# Patient Record
Sex: Female | Born: 1965 | Race: White | Hispanic: No | Smoking: Former smoker
Health system: Southern US, Community
[De-identification: ages and names within clinical notes are randomized; demographics above are authoritative.]

## PROBLEM LIST (undated history)

## (undated) DIAGNOSIS — E119 Type 2 diabetes mellitus without complications: Secondary | ICD-10-CM

## (undated) DIAGNOSIS — G4700 Insomnia, unspecified: Secondary | ICD-10-CM

## (undated) DIAGNOSIS — J449 Chronic obstructive pulmonary disease, unspecified: Secondary | ICD-10-CM

## (undated) DIAGNOSIS — F419 Anxiety disorder, unspecified: Secondary | ICD-10-CM

---

## 2015-05-29 ENCOUNTER — Encounter: Payer: Self-pay | Admitting: Physician Assistant

## 2015-05-29 ENCOUNTER — Ambulatory Visit (INDEPENDENT_AMBULATORY_CARE_PROVIDER_SITE_OTHER): Payer: Medicaid Other | Admitting: Physician Assistant

## 2015-05-29 VITALS — BP 125/94 | HR 100 | Ht 68.0 in | Wt 90.0 lb

## 2015-05-29 DIAGNOSIS — E118 Type 2 diabetes mellitus with unspecified complications: Secondary | ICD-10-CM

## 2015-05-29 DIAGNOSIS — R059 Cough, unspecified: Secondary | ICD-10-CM | POA: Insufficient documentation

## 2015-05-29 DIAGNOSIS — IMO0002 Reserved for concepts with insufficient information to code with codable children: Secondary | ICD-10-CM

## 2015-05-29 DIAGNOSIS — R7309 Other abnormal glucose: Secondary | ICD-10-CM | POA: Diagnosis not present

## 2015-05-29 DIAGNOSIS — R05 Cough: Secondary | ICD-10-CM | POA: Diagnosis not present

## 2015-05-29 DIAGNOSIS — R634 Abnormal weight loss: Secondary | ICD-10-CM

## 2015-05-29 DIAGNOSIS — E1165 Type 2 diabetes mellitus with hyperglycemia: Secondary | ICD-10-CM

## 2015-05-29 DIAGNOSIS — K5900 Constipation, unspecified: Secondary | ICD-10-CM | POA: Diagnosis not present

## 2015-05-29 DIAGNOSIS — R739 Hyperglycemia, unspecified: Secondary | ICD-10-CM

## 2015-05-29 DIAGNOSIS — Z8719 Personal history of other diseases of the digestive system: Secondary | ICD-10-CM | POA: Diagnosis not present

## 2015-05-29 DIAGNOSIS — R631 Polydipsia: Secondary | ICD-10-CM | POA: Diagnosis not present

## 2015-05-29 LAB — POCT GLYCOSYLATED HEMOGLOBIN (HGB A1C): Hemoglobin A1C: >14

## 2015-05-29 LAB — POCT URINALYSIS DIPSTICK
Bilirubin, UA: NEGATIVE
Blood, UA: NEGATIVE
Glucose, UA: 500
KETONES UA: 40
Leukocytes, UA: NEGATIVE
Nitrite, UA: NEGATIVE
PH UA: 5
PROTEIN UA: NEGATIVE
UROBILINOGEN UA: 0.2

## 2015-05-29 MED ORDER — INSULIN ASPART PROT & ASPART (70-30 MIX) 100 UNIT/ML ~~LOC~~ SUSP: 6.0000 [IU] | Freq: Two times a day (BID) | SUBCUTANEOUS | Status: DC

## 2015-05-29 MED ORDER — AMBULATORY NON FORMULARY MEDICATION: Status: DC

## 2015-05-29 MED ORDER — METFORMIN HCL 1000 MG PO TABS: 1000.0000 mg | ORAL_TABLET | Freq: Two times a day (BID) | ORAL | Status: DC

## 2015-05-29 NOTE — Patient Instructions (Addendum)
Start metformin 1000mg  twice a day with food.  Keep log of fasting blood sugar daily.  Increase units by 2units every 3 days until fasting glucose gets to be 150.    Follow up in 1 month.

## 2015-05-29 NOTE — Progress Notes (Signed)
Subjective:    Patient ID: Valerie Pratt, female    DOB: [birth year], 50 y.o.   MRN: [id number]  HPI  Patient is a 50 year old female who presents to the clinic to establish care. She is accompanied by her sister and niece. 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  .. Family History  Problem Relation Age of Onset  . Cancer Mother     bladder  . Cancer Father     leukemia   .Marland Kitchen Social History   Social History  . Marital Status: Legally Separated    Spouse Name: N/A  . Number of Children: N/A  . Years of Education: N/A   Occupational History  . Not on file.   Social History Main Topics  . Smoking status: Current Every Day Smoker  . Smokeless tobacco: Not on file  . Alcohol Use: No  . Drug Use: No  . Sexual Activity: No   Other Topics Concern  . Not on file   Social History Narrative  . No narrative on file    She is very concerned today with multiple symptoms. She's had weight loss. She is not trying to lose weight. She is typically around 110 pounds and she is now 90. She has noticed the majority of weight loss has been since March 1 this year. She has a very strong appetite. She is eating or to 5 meals a day. She is also drinking constantly. She complains of constipation, excessive thirst, weakness, muscle cramps, fatigue. She went to the emergency room a few weeks ago. Her random sugar was 267 and she was spilling greater than 500 units of sugar in her urine. She was told this was okay. She wonders why she continues to fill this bad. She has a history of pancreatitis  She does not have any insurance and this concerns her.  Review of Systems     Objective:   Physical Exam  Constitutional: She is oriented to person, place, and time.  Frail.   HENT:  Head: Normocephalic and atraumatic.  Cardiovascular: Normal rate, regular rhythm and normal heart sounds.   Pulmonary/Chest: Effort normal and breath  sounds normal. She has no wheezes.  Abdominal:  Completely disfigured abdomen. There is no visualization of abdominal muscles or omentum. You can see bulges of intestine through her skin.  Neurological: She is alert and oriented to person, place, and time.  Psychiatric: She has a normal mood and affect. Her behavior is normal.          Assessment & Plan:  Uncontrolled type 2 diabetes-A!C is greater than 14. Glucose found in UA.    Discuss with patient most of her symptoms above can be contributed to diabetes that is uncontrolled. Due to history of pancreatitis we have to be careful with the oral medications we can try. Since she do not have insurance we cannot prescribe the newer medications such as GLT-2. I'll start with metformin 1000 mg twice a day. Discussed side effects of nausea and diarrhea. Encouraged patient to start with one half tab with meals and increase once tolerated. Patient was given her first glucometer with test strips and lancets. At first we will start with testing just fasting. Since patient does not have insurance she cannot afford long-acting insulins. We are going to have to go with Novolin 70/30 I will start with 6 units twice a day with meals. We will increase by 1 unit twice a  day until fasting glucose at 150. This is not the typical way to start this medication however I'm concerned with how many times she is quite to be want to stick herself. I do not want to [facility]. I would like for her to keep a log of her sugars in follow-up in one month. I strongly would consider getting on Medicaid. Certainly if we can keep the sugars down she would be feeling a lot better. Discussed signs and symptoms that she might need to go to the emergency room. She will give the intermediate acting insulin in her thighs. Her abdomen is too frail.  Cough- reassured lungs sounded good. Follow up if worsened.

## 2015-05-30 ENCOUNTER — Telehealth: Payer: Self-pay | Admitting: Physician Assistant

## 2015-05-30 ENCOUNTER — Other Ambulatory Visit: Payer: Self-pay | Admitting: Physician Assistant

## 2015-05-30 LAB — CBC WITH DIFFERENTIAL/PLATELET
BASOS PCT: 0 %
Basophils Absolute: 0 cells/uL (ref 0–200)
EOS PCT: 0 %
Eosinophils Absolute: 0 cells/uL — ABNORMAL LOW (ref 15–500)
HCT: 50 % — ABNORMAL HIGH (ref 35.0–45.0)
HEMOGLOBIN: 16.5 g/dL — AB (ref 11.7–15.5)
LYMPHS ABS: 1900 {cells}/uL (ref 850–3900)
LYMPHS PCT: 20 %
MCH: 30.4 pg (ref 27.0–33.0)
MCHC: 33 g/dL (ref 32.0–36.0)
MCV: 92.3 fL (ref 80.0–100.0)
MONO ABS: 570 {cells}/uL (ref 200–950)
MPV: 9.5 fL (ref 7.5–12.5)
Monocytes Relative: 6 %
NEUTROS PCT: 74 %
Neutro Abs: 7030 cells/uL (ref 1500–7800)
Platelets: 303 10*3/uL (ref 140–400)
RBC: 5.42 MIL/uL — AB (ref 3.80–5.10)
RDW: 14.3 % (ref 11.0–15.0)
WBC: 9.5 10*3/uL (ref 3.8–10.8)

## 2015-05-30 MED ORDER — INSULIN NPH ISOPHANE & REGULAR (70-30) 100 UNIT/ML ~~LOC~~ SUSP: 6.0000 [IU] | Freq: Two times a day (BID) | SUBCUTANEOUS | Status: DC

## 2015-05-30 NOTE — Telephone Encounter (Signed)
I sent over novolin. I took novolog off chart.

## 2015-05-30 NOTE — Telephone Encounter (Signed)
Pharmacist called to verify insulin Rx. States there were phone calls made yesterday to determine the cheapest insulin. The Rx sent over was for Novolog 70/30, this is much more expensive than Novolin 70/30. Pharmacist wants to know if they should continue with Novolog Rx or if we will send new Rx for Novolin. Will route to PCP and medical assistant for review.

## 2015-05-30 NOTE — Progress Notes (Signed)
Sent corrected insulin.

## 2015-05-30 NOTE — Telephone Encounter (Signed)
Pharmacy notified of corrected Rx. Pt will pick up today.

## 2015-05-31 LAB — COMPLETE METABOLIC PANEL WITH GFR
ALT: 14 U/L (ref 6–29)
AST: 16 U/L (ref 10–35)
Albumin: 4.4 g/dL (ref 3.6–5.1)
Alkaline Phosphatase: 59 U/L (ref 33–130)
BUN: 14 mg/dL (ref 7–25)
CO2: 23 mmol/L (ref 20–31)
Calcium: 9 mg/dL (ref 8.6–10.4)
Chloride: 94 mmol/L — ABNORMAL LOW (ref 98–110)
Creat: 0.48 mg/dL — ABNORMAL LOW (ref 0.50–1.05)
GFR, Est African American: >89 mL/min (ref >=60)
GLUCOSE: 254 mg/dL — AB (ref 65–99)
POTASSIUM: 5.2 mmol/L (ref 3.5–5.3)
SODIUM: 135 mmol/L (ref 135–146)
Total Bilirubin: 0.8 mg/dL (ref 0.2–1.2)
Total Protein: 6.8 g/dL (ref 6.1–8.1)

## 2015-05-31 LAB — C-PEPTIDE: C PEPTIDE: 0.22 ng/mL — AB (ref 0.80–3.85)

## 2015-05-31 LAB — TSH: TSH: 0.67 mIU/L

## 2015-05-31 LAB — T4, FREE: Free T4: 1.1 ng/dL (ref 0.8–1.8)

## 2015-05-31 LAB — T3, FREE: T3 FREE: 1.6 pg/mL — AB (ref 2.3–4.2)

## 2015-06-04 ENCOUNTER — Encounter: Payer: Self-pay | Admitting: Physician Assistant

## 2015-06-04 DIAGNOSIS — E109 Type 1 diabetes mellitus without complications: Secondary | ICD-10-CM | POA: Insufficient documentation

## 2015-06-26 ENCOUNTER — Encounter: Payer: Self-pay | Admitting: Physician Assistant

## 2015-06-26 ENCOUNTER — Ambulatory Visit (INDEPENDENT_AMBULATORY_CARE_PROVIDER_SITE_OTHER): Payer: Medicaid Other | Admitting: Physician Assistant

## 2015-06-26 VITALS — BP 105/64 | HR 92 | Ht 68.0 in | Wt 98.0 lb

## 2015-06-26 DIAGNOSIS — E1065 Type 1 diabetes mellitus with hyperglycemia: Secondary | ICD-10-CM

## 2015-06-26 DIAGNOSIS — E1043 Type 1 diabetes mellitus with diabetic autonomic (poly)neuropathy: Secondary | ICD-10-CM | POA: Diagnosis not present

## 2015-06-26 DIAGNOSIS — IMO0002 Reserved for concepts with insufficient information to code with codable children: Secondary | ICD-10-CM

## 2015-06-26 NOTE — Progress Notes (Signed)
   Subjective:    Patient ID: Valerie Pratt, female    DOB: 1965/01/12, 50 y.o.   MRN: [id number]  HPI  Pt is a 50 yo female who presents to the clinic for DM follow up at one month. At time of dx her A!C was greater than 14. She was losing weight. She had excessive thirst. She has no insurance so we had to use insulin she could afford. One month into treatment and she is at 11 units twice a day of NPH. She brings in logs and appears all her sugars have decreased; however, her fasting and lunch sugars have decreased more. Her before bed sugars still in 300's. She has had a few lows of 40-50 but eating quickly made her feel better. She is having some worsening vision bilaterally. She has gained 8lbs and much less thirsty. She is overall feeling better already.    Review of Systems  All other systems reviewed and are negative.      Objective:   Physical Exam  Constitutional: She appears well-developed and well-nourished.  Cardiovascular: Normal rate, regular rhythm and normal heart sounds.   Psychiatric: She has a normal mood and affect. Her behavior is normal.          Assessment & Plan:  Type 1 DM- currently we are trying to keep her sugars as controlled as we can until she can get insurance and see a specialist. Right now she is cash pay and cannot afford long acting insulin or specialist co-pay.   Vision changes- needs to see opthalmology.   Discussed how to use NPH insulin since fasting still not at goal increase every 3 days by 1 unit until fasting at 150. Check sugar before every meal and tirate using chart to accommodate meal time.  Follow up in 2 months.  She declines lipid testing and pneumonia shot due to cost.

## 2015-06-26 NOTE — Patient Instructions (Addendum)

## 2015-06-28 DIAGNOSIS — IMO0002 Reserved for concepts with insufficient information to code with codable children: Secondary | ICD-10-CM | POA: Insufficient documentation

## 2015-06-28 DIAGNOSIS — E1043 Type 1 diabetes mellitus with diabetic autonomic (poly)neuropathy: Secondary | ICD-10-CM | POA: Insufficient documentation

## 2015-06-28 DIAGNOSIS — E1065 Type 1 diabetes mellitus with hyperglycemia: Principal | ICD-10-CM

## 2015-07-16 ENCOUNTER — Telehealth: Payer: Self-pay | Admitting: *Deleted

## 2015-07-16 NOTE — Telephone Encounter (Signed)
Patient called and states she now has medicaid . She wanted to know if you wanted to change any of her medications.she has an appointment with you on 8/21

## 2015-07-17 ENCOUNTER — Other Ambulatory Visit: Payer: Self-pay | Admitting: Physician Assistant

## 2015-07-17 MED ORDER — INSULIN LISPRO 100 UNIT/ML ~~LOC~~ SOLN: 4.0000 [IU] | Freq: Three times a day (TID) | SUBCUTANEOUS | Status: DC

## 2015-07-17 MED ORDER — INSULIN GLARGINE 300 UNIT/ML ~~LOC~~ SOPN: 10.0000 [IU] | PEN_INJECTOR | Freq: Every day | SUBCUTANEOUS | Status: DC

## 2015-07-17 NOTE — Telephone Encounter (Signed)
STOP novolin 70/30. Start toujeo 10 units increasing by 2 units ever 3-5 days until fasting sugars at 100-120. Meal time insulin 4 units and then sliding scale. Come by office to pick up sliding scale.

## 2015-07-17 NOTE — Telephone Encounter (Signed)
Patient notified and voiced understanding and sliding scale up front. She will come by to pick up the sliding scale info tomorrow

## 2015-07-24 ENCOUNTER — Telehealth: Payer: Self-pay | Admitting: *Deleted

## 2015-07-24 NOTE — Telephone Encounter (Signed)
[facility] Toujeo would be denied because step therapy is required. Has to have tried levemir or lantus first.  New mediaid ID # is [id number] q. Note placed in [facility]

## 2015-07-28 ENCOUNTER — Other Ambulatory Visit: Payer: Self-pay | Admitting: Physician Assistant

## 2015-08-01 ENCOUNTER — Other Ambulatory Visit: Payer: Self-pay | Admitting: *Deleted

## 2015-08-01 ENCOUNTER — Telehealth: Payer: Self-pay | Admitting: *Deleted

## 2015-08-01 MED ORDER — AMBULATORY NON FORMULARY MEDICATION: 1 refills | Status: DC

## 2015-08-01 NOTE — Telephone Encounter (Signed)
Pt called stating that since the Toujeo was denied by medicaid due to needing to have tried levemir or lantus first.  She would like for you to send one of those in for her.

## 2015-08-15 ENCOUNTER — Other Ambulatory Visit: Payer: Self-pay | Admitting: *Deleted

## 2015-08-15 MED ORDER — AMBULATORY NON FORMULARY MEDICATION: 0 refills | Status: DC

## 2015-08-15 NOTE — Progress Notes (Unsigned)
Non amb

## 2015-08-26 ENCOUNTER — Encounter: Payer: Self-pay | Admitting: Physician Assistant

## 2015-08-26 ENCOUNTER — Ambulatory Visit (INDEPENDENT_AMBULATORY_CARE_PROVIDER_SITE_OTHER): Payer: Medicaid Other | Admitting: Physician Assistant

## 2015-08-26 VITALS — BP 121/71 | HR 90 | Ht 68.0 in | Wt 103.0 lb

## 2015-08-26 DIAGNOSIS — Z720 Tobacco use: Secondary | ICD-10-CM | POA: Diagnosis not present

## 2015-08-26 DIAGNOSIS — R05 Cough: Secondary | ICD-10-CM | POA: Diagnosis not present

## 2015-08-26 DIAGNOSIS — F329 Major depressive disorder, single episode, unspecified: Secondary | ICD-10-CM

## 2015-08-26 DIAGNOSIS — F32A Depression, unspecified: Secondary | ICD-10-CM | POA: Insufficient documentation

## 2015-08-26 DIAGNOSIS — J449 Chronic obstructive pulmonary disease, unspecified: Secondary | ICD-10-CM | POA: Insufficient documentation

## 2015-08-26 DIAGNOSIS — E109 Type 1 diabetes mellitus without complications: Secondary | ICD-10-CM | POA: Diagnosis not present

## 2015-08-26 DIAGNOSIS — E1065 Type 1 diabetes mellitus with hyperglycemia: Principal | ICD-10-CM

## 2015-08-26 DIAGNOSIS — Z23 Encounter for immunization: Secondary | ICD-10-CM

## 2015-08-26 DIAGNOSIS — F17200 Nicotine dependence, unspecified, uncomplicated: Secondary | ICD-10-CM | POA: Insufficient documentation

## 2015-08-26 DIAGNOSIS — R059 Cough, unspecified: Secondary | ICD-10-CM

## 2015-08-26 DIAGNOSIS — IMO0001 Reserved for inherently not codable concepts without codable children: Secondary | ICD-10-CM

## 2015-08-26 LAB — POCT GLYCOSYLATED HEMOGLOBIN (HGB A1C): HEMOGLOBIN A1C: 7.4

## 2015-08-26 MED ORDER — METFORMIN HCL 1000 MG PO TABS: 1000.0000 mg | ORAL_TABLET | Freq: Two times a day (BID) | ORAL | 2 refills | Status: DC

## 2015-08-26 MED ORDER — INSULIN NPH ISOPHANE & REGULAR (70-30) 100 UNIT/ML ~~LOC~~ SUSP: 6.0000 [IU] | Freq: Two times a day (BID) | SUBCUTANEOUS | 2 refills | Status: DC

## 2015-08-26 MED ORDER — BUPROPION HCL ER (SR) 150 MG PO TB12: ORAL_TABLET | ORAL | 1 refills | Status: DC

## 2015-08-26 MED ORDER — AMBULATORY NON FORMULARY MEDICATION: 3 refills | Status: DC

## 2015-08-26 MED ORDER — ALBUTEROL SULFATE HFA 108 (90 BASE) MCG/ACT IN AERS: 2.0000 | INHALATION_SPRAY | Freq: Four times a day (QID) | RESPIRATORY_TRACT | 2 refills | Status: DC | PRN

## 2015-08-26 NOTE — Progress Notes (Signed)
   Subjective:    Patient ID: Valerie Pratt, female    DOB: 03/31/1965, 50 y.o.   MRN: [id number]  HPI  Patient is a 50 year old female who presents to the clinic with type 1 diabetes. She is previously not had insurance and we have had to treat her here in primary care. She recently received Medicaid and I would like to send her to an endocrinologist. She is taking Novolin 70/30 8-10 units twice a day. Her sugars are very frustrating to her. She can go as low as 35 and is high as 3 or 5 and a day. She reports at least one hypoglycemic event a week. She admits she is under stress. She is having a headache every day since may. She denies any nausea, vomiting, photosensitivity, phono sensitivity. Headache is located on the top of the head. Tylenol does help with pain. She denies any neck pain. She does feel very down. She does not notice the disease with a constant monitoring. She feels like all she does is check her sugars and worry about what she is eating.  She also reports an ongoing productive cough. She has some shortness of breath at times. She is a current smoker. She is interested in quitting.    Review of Systems See HPI.     Objective:   Physical Exam  Constitutional: She is oriented to person, place, and time. She appears well-developed and well-nourished.  HENT:  Head: Normocephalic and atraumatic.  Cardiovascular: Normal rate, regular rhythm and normal heart sounds.   Pulmonary/Chest: Effort normal and breath sounds normal.  Neurological: She is alert and oriented to person, place, and time.  Psychiatric: She has a normal mood and affect. Her behavior is normal.          Assessment & Plan:  DM, type I- A!C today is 7.4.  Continue same 70/30 novolin. She has medicaid now so likely could afford different better meds.  Foot exam normal with some decrease in sensation left heel.  mirco elevated. Will call and see if she wants to start ACE for kidney protection.  Pt states  she is making eye exam.  Will make endocrinology referral.   pneuomnia 23 and flu shot given today.   Current smoker- would like to try wellbutrin before chantix. Sent wellbutrin SR bid. Follow up in 2 months. Discussed tapering daily cigarettes.   Depression- PhQ-9 was 18. Added wellbutrin for smoking cessation will see if helps with mood as well. Follow up in 2 months.   Cough- needs spirometry due to smoking hx likely has COPD. Albuterol inhaler given to use as needed.   Pt needs a physical to go over health prevention and screening items.

## 2015-08-26 NOTE — Patient Instructions (Signed)
Needs spironmetry appt/

## 2015-08-27 ENCOUNTER — Other Ambulatory Visit: Payer: Self-pay | Admitting: Physician Assistant

## 2015-08-27 ENCOUNTER — Encounter: Payer: Self-pay | Admitting: Physician Assistant

## 2015-08-27 LAB — POCT UA - MICROALBUMIN
Creatinine, POC: 50 mg/dL
Microalbumin Ur, POC: 10 mg/L

## 2015-08-27 MED ORDER — LISINOPRIL 5 MG PO TABS: 5.0000 mg | ORAL_TABLET | Freq: Every day | ORAL | 2 refills | Status: DC

## 2015-08-27 NOTE — Progress Notes (Signed)
lsi

## 2015-08-29 ENCOUNTER — Other Ambulatory Visit: Payer: Self-pay

## 2015-08-29 MED ORDER — "INSULIN SYRINGE 31G X 5/16"" 0.3 ML MISC": 99 refills | Status: DC

## 2015-09-23 ENCOUNTER — Other Ambulatory Visit: Payer: Medicaid Other

## 2015-09-30 ENCOUNTER — Ambulatory Visit (INDEPENDENT_AMBULATORY_CARE_PROVIDER_SITE_OTHER): Payer: Medicaid Other | Admitting: Physician Assistant

## 2015-09-30 ENCOUNTER — Encounter: Payer: Self-pay | Admitting: Physician Assistant

## 2015-09-30 VITALS — BP 105/59 | HR 80 | Ht 68.0 in | Wt 103.0 lb

## 2015-09-30 DIAGNOSIS — F329 Major depressive disorder, single episode, unspecified: Secondary | ICD-10-CM

## 2015-09-30 DIAGNOSIS — G479 Sleep disorder, unspecified: Secondary | ICD-10-CM | POA: Diagnosis not present

## 2015-09-30 DIAGNOSIS — Z1231 Encounter for screening mammogram for malignant neoplasm of breast: Secondary | ICD-10-CM | POA: Diagnosis not present

## 2015-09-30 DIAGNOSIS — F32A Depression, unspecified: Secondary | ICD-10-CM

## 2015-09-30 DIAGNOSIS — IMO0002 Reserved for concepts with insufficient information to code with codable children: Secondary | ICD-10-CM

## 2015-09-30 DIAGNOSIS — E1065 Type 1 diabetes mellitus with hyperglycemia: Secondary | ICD-10-CM

## 2015-09-30 DIAGNOSIS — Z87891 Personal history of nicotine dependence: Secondary | ICD-10-CM | POA: Insufficient documentation

## 2015-09-30 DIAGNOSIS — E1043 Type 1 diabetes mellitus with diabetic autonomic (poly)neuropathy: Secondary | ICD-10-CM

## 2015-09-30 MED ORDER — TRAZODONE HCL 50 MG PO TABS: 25.0000 mg | ORAL_TABLET | Freq: Every evening | ORAL | 1 refills | Status: DC | PRN

## 2015-09-30 MED ORDER — BUPROPION HCL ER (SR) 150 MG PO TB12: ORAL_TABLET | ORAL | 5 refills | Status: DC

## 2015-09-30 NOTE — Progress Notes (Addendum)
Subjective:     Patient ID: Valerie Pratt, female   DOB: 1965/08/30, 50 y.o.   MRN: [id number]  HPI Patient is a 50 y.o. Caucasian female presenting today for a follow-up on her diabetes management and medications. The patient reports that she is doing much better when on Wellbutrin and that she completely quit smoking 2 weeks ago. The patient states she has noted an improvement in her mood swings but she is still having trouble staying asleep. The patient notes that she has taken melatonin and valerian root with some symptomatic improvement. The patient states she has also gained 13 lbs since her last appointment and has seen improvements in her life. The patient does state that she has some double vision and dizziness especially after her insulin injections. The patient denies shortness of breath, chest pain, palpitations, fever, or activity changes.  Review of Systems  Constitutional: Negative for activity change, appetite change, chills, diaphoresis, fatigue, fever and unexpected weight change.  HENT: Negative for congestion, ear discharge, ear pain, rhinorrhea, sinus pressure, sneezing and sore throat.   Eyes: Negative for photophobia, pain, discharge, redness and itching.  Respiratory: Negative for cough, choking, chest tightness, shortness of breath and wheezing.   Cardiovascular: Negative for chest pain, palpitations and leg swelling.  Gastrointestinal: Negative.   Genitourinary: Negative.   Musculoskeletal: Negative.   Skin: Negative.   Neurological: Positive for dizziness. Negative for syncope, weakness, light-headedness, numbness and headaches.  Psychiatric/Behavioral: Negative.       Objective:   Physical Exam  Constitutional: She is oriented to person, place, and time. She appears well-developed and well-nourished. No distress.  HENT:  Head: Normocephalic and atraumatic.  Right Ear: External ear normal.  Left Ear: External ear normal.  Nose: Nose normal.  Mouth/Throat:  Oropharynx is clear and moist. No oropharyngeal exudate.  Eyes: Conjunctivae and EOM are normal. Pupils are equal, round, and reactive to light. Right eye exhibits no discharge. Left eye exhibits no discharge. No scleral icterus.  Neck: Normal range of motion. Neck supple. No JVD present. No tracheal deviation present. No thyromegaly present.  Cardiovascular: Normal rate, regular rhythm, normal heart sounds and intact distal pulses.  Exam reveals no gallop and no friction rub.   No murmur heard. Pulmonary/Chest: Effort normal and breath sounds normal. No stridor. No respiratory distress. She has no wheezes. She has no rales. She exhibits no tenderness.  Abdominal: Soft. She exhibits no distension and no mass. There is no tenderness. There is no rebound and no guarding.  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  Lymphadenopathy:    She has no cervical adenopathy.  Neurological: She is alert and oriented to person, place, and time. No cranial nerve deficit. Coordination normal.  Skin: Skin is warm and dry. No rash noted. She is not diaphoretic. No erythema. No pallor.  Psychiatric: She has a normal mood and affect. Her behavior is normal. Judgment and thought content normal.  Nursing note and vitals reviewed.     Assessment:     Diagnoses and all orders for this visit:  Depression -     buPROPion (WELLBUTRIN SR) 150 MG 12 hr tablet; Take one tablet twice a day.  Visit for screening mammogram -     MM DIGITAL SCREENING BILATERAL; Future -     MM DIGITAL SCREENING BILATERAL  Sleeping difficulties -     traZODone (DESYREL) 50 MG tablet; Take 0.5-1 tablets (25-50 mg total) by mouth at bedtime as needed for sleep.   Plan:  1. Depression - Patient with PHQ-9 with a score of 12 and GAD-7 with a score of 9. Patient to continue on wellbutrin 150mg  tablets once daily at this time. Patient given prescription refill. Patient states that she has experienced  improvement on this medication. Patient to return-to-clinic in 4-6 weeks to assess depression and anxiety status. Will determine need for medication adjustments at that time.   2. Sleeping difficulties - Discussed with patient that she should continue with over-the-counter sleep aids such as melatonin, valerian root, and Unisom. Patient was given a prescription for trazodone 50mg  tablets as needed for sleep. Patient is aware that she should start with 1/2 tablet if over-the-counter sleep aids are not effective. Will continue to monitor.   3. Former Smoker- quit 2 weeks ago. SOB and cough improving if not continuing to improve then need to consider spirometry.   Summary - Patient given requisition form for screening mammogram and optometry.

## 2015-09-30 NOTE — Addendum Note (Signed)
Addended by: Jomarie LongsBREEBACK, Cyncere Ruhe L on: 09/30/2015 04:39 PM   Modules accepted: Orders

## 2015-11-14 LAB — HM DIABETES EYE EXAM

## 2015-12-03 ENCOUNTER — Other Ambulatory Visit: Payer: Self-pay | Admitting: Physician Assistant

## 2015-12-03 DIAGNOSIS — G479 Sleep disorder, unspecified: Secondary | ICD-10-CM

## 2015-12-16 ENCOUNTER — Encounter: Payer: Self-pay | Admitting: Physician Assistant

## 2015-12-16 ENCOUNTER — Ambulatory Visit (INDEPENDENT_AMBULATORY_CARE_PROVIDER_SITE_OTHER): Payer: Medicaid Other | Admitting: Physician Assistant

## 2015-12-16 VITALS — BP 118/70 | HR 105 | Temp 101.2°F | Ht 68.0 in | Wt 111.0 lb

## 2015-12-16 DIAGNOSIS — J029 Acute pharyngitis, unspecified: Secondary | ICD-10-CM

## 2015-12-16 DIAGNOSIS — F331 Major depressive disorder, recurrent, moderate: Secondary | ICD-10-CM

## 2015-12-16 DIAGNOSIS — F411 Generalized anxiety disorder: Secondary | ICD-10-CM | POA: Diagnosis not present

## 2015-12-16 DIAGNOSIS — Z20818 Contact with and (suspected) exposure to other bacterial communicable diseases: Secondary | ICD-10-CM | POA: Diagnosis not present

## 2015-12-16 LAB — POCT RAPID STREP A (OFFICE): RAPID STREP A SCREEN: NEGATIVE

## 2015-12-16 MED ORDER — SERTRALINE HCL 50 MG PO TABS: 50.0000 mg | ORAL_TABLET | Freq: Every day | ORAL | 1 refills | Status: DC

## 2015-12-16 MED ORDER — CLARITHROMYCIN 250 MG PO TABS: 250.0000 mg | ORAL_TABLET | Freq: Two times a day (BID) | ORAL | 0 refills | Status: DC

## 2015-12-16 NOTE — Progress Notes (Addendum)
   Subjective:    Patient ID: Valerie Pratt, female    DOB: [birth year], 50 y.o.   MRN: [id number]  HPI  Pt is a 50 yo female who presents to the clinic with ST for 2 days. Her granddaughter had strep and was all over her this weekend. She has fever and nauseated. Not tried anything to make better. No appetite.   She stopped wellbutrin. Was put on to stop smoking and has stopped since 09/2015. She now feels like was making her more anxious and irritable. She stopped it and feels less anxious but still having mood issues and feels very "emotional". Pt denies any suicidal thoughts. She has not tried anything other than wellbutrin.    Review of Systems  Constitutional: Positive for fatigue and fever.  HENT: Positive for sore throat.   Gastrointestinal: Positive for nausea.       Objective:   Physical Exam  Constitutional: She is oriented to person, place, and time. She appears well-developed and well-nourished.  HENT:  Head: Normocephalic and atraumatic.  Right Ear: External ear normal.  Left Ear: External ear normal.  Nose: Nose normal.  Mouth/Throat: No oropharyngeal exudate.  TM's clear bilaterally.  Oropharynx erythematous with clear drainage. No tonsillar swelling.   Eyes: Conjunctivae are normal.  Neck: Normal range of motion. Neck supple.  Cardiovascular: Normal rate, regular rhythm and normal heart sounds.   Pulmonary/Chest: Effort normal and breath sounds normal. She has no wheezes.  Lymphadenopathy:    She has cervical adenopathy.  Neurological: She is alert and oriented to person, place, and time.  Psychiatric: She has a normal mood and affect. Her behavior is normal.          Assessment & Plan:  Marland Kitchen.Marland Kitchen.Valerie Pratt was seen today for depression, anxiety and sore throat.  Diagnoses and all orders for this visit:  Sore throat -     POCT rapid strep A -     clarithromycin (BIAXIN) 250 MG tablet; Take 1 tablet (250 mg total) by mouth 2 (two) times daily. For 10  days.  Exposure to strep throat -     POCT rapid strep A -     clarithromycin (BIAXIN) 250 MG tablet; Take 1 tablet (250 mg total) by mouth 2 (two) times daily. For 10 days.  Moderate episode of recurrent major depressive disorder (HCC) -     sertraline (ZOLOFT) 50 MG tablet; Take 1 tablet (50 mg total) by mouth daily.  Rapid strep negative but with fever, ST, no cough and exposure risk is high for strep. Pt is PCN allergic. Biaxin sent for 10 days.   PHQ-9 was 16. GAD-7 was 16. Started zoloft 1/2 tablet for first 7 days then increase to full tablet. Discussed side effects follow up in 4-6 weeks for recheck.

## 2016-01-13 ENCOUNTER — Ambulatory Visit (INDEPENDENT_AMBULATORY_CARE_PROVIDER_SITE_OTHER): Payer: Medicaid Other | Admitting: Physician Assistant

## 2016-01-13 ENCOUNTER — Encounter: Payer: Self-pay | Admitting: Physician Assistant

## 2016-01-13 VITALS — BP 123/71 | HR 66 | Ht 68.0 in | Wt 110.0 lb

## 2016-01-13 DIAGNOSIS — Z13220 Encounter for screening for lipoid disorders: Secondary | ICD-10-CM | POA: Diagnosis not present

## 2016-01-13 DIAGNOSIS — G479 Sleep disorder, unspecified: Secondary | ICD-10-CM

## 2016-01-13 DIAGNOSIS — Z1231 Encounter for screening mammogram for malignant neoplasm of breast: Secondary | ICD-10-CM | POA: Diagnosis not present

## 2016-01-13 DIAGNOSIS — Z1239 Encounter for other screening for malignant neoplasm of breast: Secondary | ICD-10-CM | POA: Insufficient documentation

## 2016-01-13 DIAGNOSIS — Z1211 Encounter for screening for malignant neoplasm of colon: Secondary | ICD-10-CM

## 2016-01-13 DIAGNOSIS — F331 Major depressive disorder, recurrent, moderate: Secondary | ICD-10-CM

## 2016-01-13 MED ORDER — TRAZODONE HCL 50 MG PO TABS: ORAL_TABLET | ORAL | 1 refills | Status: DC

## 2016-01-13 MED ORDER — SERTRALINE HCL 50 MG PO TABS: 50.0000 mg | ORAL_TABLET | Freq: Every day | ORAL | 1 refills | Status: DC

## 2016-01-13 NOTE — Progress Notes (Addendum)
   Subjective:    Patient ID: Valerie Pratt, female    DOB: [birth year], 51 y.o.   MRN: [id number]  HPI Patient is a 51yo female presenting for follow-up after starting Zoloft for her depression and GAD.  Patient states she feels both her anxiety and depression are improving.  Patient states her appetite has improved to "normal." Patient states she is still having difficulty sleeping at night.  She states she has trouble staying asleep and typically sleeps about 5 hours per night.  She states she takes her trazodone most nights.  She states she has been able to "resist" her fatigue during the day, but she still feels like she lacks energy most days. Patient states she has not received any counseling, but she has been praying a lot.  Patient states she also tries to exercise when she can (usually walking outside), which helps to clear her mind.  Overall, she feels significantly better after starting zoloft and denies any suicidal or homicidal ideation.  PHQ9: 5  GAD-7: 6  Patient denies having a recent lipid disorder screen.  She also denies having a colonoscopy or mammogram in the past.    Review of Systems  All other systems reviewed and are negative.      Objective:   Physical Exam  Constitutional: She is oriented to person, place, and time. She appears well-developed and well-nourished.  HENT:  Head: Atraumatic.  Cardiovascular: Normal rate, regular rhythm and normal heart sounds.   Pulmonary/Chest: Effort normal and breath sounds normal.  Neurological: She is alert and oriented to person, place, and time.  Psychiatric: She has a normal mood and affect. Her speech is normal and behavior is normal. Thought content normal.  Nursing note and vitals reviewed.         Assessment & Plan:  Marland Kitchen.Marland Kitchen.Valerie Pratt was seen today for depression and anxiety.  Diagnoses and all orders for this visit:  Screening for lipid disorders -     Lipid panel  Moderate episode of recurrent major depressive  disorder (HCC) -     sertraline (ZOLOFT) 50 MG tablet; Take 1 tablet (50 mg total) by mouth daily.  Sleeping difficulties -     traZODone (DESYREL) 50 MG tablet; TAKE 1-2 TABLETS BY MOUTH AT BEDTIME AS NEEDED FOR SLEEP.  Breast cancer screening -     MM DIGITAL SCREENING BILATERAL; Future -     MM DIGITAL SCREENING BILATERAL  Colon cancer screening     Patient will continue to take her zoloft 50mg   Since patient has not experienced any side effects and is tolerating zoloft, she will need to follow-up in 6 months.  Patient can also take 1-2 tablets of trazodone 50mg  nightly as needed for sleep.  Patient agrees to doing Cologuard and getting a screening mammogram for health maintenance.      Patient will get a lipid panel to screen for lipid disorders.  Patient's type 1 diabetes is managed by endocrinology. Patient performed an eye exam at [facility] this year.  Follow-up in 6 months for depression.  Patient understands she may need to follow-up sooner if lipid panel comes back abnormal.

## 2016-01-15 ENCOUNTER — Encounter: Payer: Self-pay | Admitting: Physician Assistant

## 2016-01-15 DIAGNOSIS — H2513 Age-related nuclear cataract, bilateral: Secondary | ICD-10-CM | POA: Insufficient documentation

## 2016-01-17 LAB — LIPID PANEL
Cholesterol: 154 mg/dL (ref <200)
HDL: 71 mg/dL (ref >50)
LDL Cholesterol: 73 mg/dL (ref <100)
Total CHOL/HDL Ratio: 2.2 Ratio (ref <5.0)
Triglycerides: 50 mg/dL (ref <150)
VLDL: 10 mg/dL (ref <30)

## 2016-01-20 MED ORDER — PRAVASTATIN SODIUM 20 MG PO TABS: 20.0000 mg | ORAL_TABLET | Freq: Every day | ORAL | 3 refills | Status: DC

## 2016-01-20 NOTE — Addendum Note (Signed)
Addended by: Jomarie LongsBREEBACK, JADE L on: 01/20/2016 10:04 PM   Modules accepted: Orders

## 2016-01-21 ENCOUNTER — Ambulatory Visit (INDEPENDENT_AMBULATORY_CARE_PROVIDER_SITE_OTHER): Payer: Medicaid Other

## 2016-01-21 DIAGNOSIS — Z1231 Encounter for screening mammogram for malignant neoplasm of breast: Secondary | ICD-10-CM

## 2016-01-21 NOTE — Progress Notes (Signed)
Call pt: screening mammogram is normal.

## 2016-01-22 ENCOUNTER — Encounter: Payer: Self-pay | Admitting: Physician Assistant

## 2016-03-30 ENCOUNTER — Ambulatory Visit: Payer: Medicaid Other | Admitting: Physician Assistant

## 2016-03-31 LAB — HEMOGLOBIN A1C: HEMOGLOBIN A1C: 6.7

## 2016-05-12 ENCOUNTER — Other Ambulatory Visit: Payer: Self-pay | Admitting: Physician Assistant

## 2016-05-12 DIAGNOSIS — G479 Sleep disorder, unspecified: Secondary | ICD-10-CM

## 2016-06-09 ENCOUNTER — Ambulatory Visit (INDEPENDENT_AMBULATORY_CARE_PROVIDER_SITE_OTHER): Payer: Medicaid Other | Admitting: Physician Assistant

## 2016-06-09 ENCOUNTER — Encounter: Payer: Self-pay | Admitting: Physician Assistant

## 2016-06-09 ENCOUNTER — Telehealth: Payer: Self-pay | Admitting: Physician Assistant

## 2016-06-09 VITALS — BP 110/73 | HR 80 | Ht 68.0 in | Wt 112.0 lb

## 2016-06-09 DIAGNOSIS — H1013 Acute atopic conjunctivitis, bilateral: Secondary | ICD-10-CM

## 2016-06-09 DIAGNOSIS — F331 Major depressive disorder, recurrent, moderate: Secondary | ICD-10-CM

## 2016-06-09 DIAGNOSIS — Z23 Encounter for immunization: Secondary | ICD-10-CM

## 2016-06-09 DIAGNOSIS — L03311 Cellulitis of abdominal wall: Secondary | ICD-10-CM | POA: Diagnosis not present

## 2016-06-09 DIAGNOSIS — G479 Sleep disorder, unspecified: Secondary | ICD-10-CM | POA: Diagnosis not present

## 2016-06-09 MED ORDER — TRAZODONE HCL 50 MG PO TABS: ORAL_TABLET | ORAL | 3 refills | Status: DC

## 2016-06-09 MED ORDER — AZELASTINE HCL 0.05 % OP SOLN: 1.0000 [drp] | Freq: Two times a day (BID) | OPHTHALMIC | 11 refills | Status: DC

## 2016-06-09 MED ORDER — DOXYCYCLINE HYCLATE 100 MG PO TABS: 100.0000 mg | ORAL_TABLET | Freq: Two times a day (BID) | ORAL | 0 refills | Status: DC

## 2016-06-09 MED ORDER — SERTRALINE HCL 50 MG PO TABS: 50.0000 mg | ORAL_TABLET | Freq: Every day | ORAL | 3 refills | Status: DC

## 2016-06-09 NOTE — Progress Notes (Signed)
Subjective:    Patient ID: Valerie Pratt, female    DOB: 30-Nov-1965, 51 y.o.   MRN: [id number]  HPI Pt is a 51 yo type 1 DM who presents to the clinic for medication follow up and refill.   DM- followed by endocrinology. Reports last a1c to be 6.4.   MDD- doing great. Mood is much better. She is happy and active.   Sleeping difficulties- doing great on trazodone. No problems or concerns.   She is concerned with some redness and pain over old scar tissue where her intestines healed on top of her abdominal wall. Noticed this for 2-3 weeks. There has been some opening up of scar tissue and some oozing out. It is tender and warm to touch. Not tried anything to make better. No fever, chills, body aches.   Pt is also having some bilateral eye itching and watering. She is not taking any anti-histamine.  .. Active Ambulatory Problems    Diagnosis Date Noted  . History of pancreatitis 05/29/2015  . Constipation 05/29/2015  . Cough 05/29/2015  . Unintentional weight loss 05/29/2015  . Excessive thirst 05/29/2015  . Elevated random blood glucose level 05/29/2015  . Type I diabetes mellitus (HCC) 06/04/2015  . 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 Current smoker 08/26/2015  . Chronic obstructive pulmonary disease (HCC) 08/26/2015  . Depression 08/26/2015  . Former smoker 09/30/2015  . Sleeping difficulties 09/30/2015  . GAD (generalized anxiety disorder) 12/16/2015  . Breast cancer screening 01/13/2016  . Screening for lipid disorders 01/13/2016  . Colon cancer screening 01/13/2016  . Nuclear sclerotic cataract of both eyes 01/15/2016   Resolved Ambulatory Problems    Diagnosis Date Noted  . No Resolved Ambulatory Problems   No Additional Past Medical History        Review of Systems See HPI.     Objective:   Physical Exam  Constitutional: She is oriented to person, place, and time. She appears well-developed and  well-nourished.  HENT:  Head: Normocephalic and atraumatic.  Eyes: Conjunctivae are normal.  Injected conjunctiva with watery discharge.   Neck: Normal range of motion. Neck supple.  Cardiovascular: Normal rate, regular rhythm and normal heart sounds.   Pulmonary/Chest: Effort normal and breath sounds normal.  Neurological: She is alert and oriented to person, place, and time.  Skin:  5cm by 4cm area of warmth, redness, tenderness with scabbed over and oozing lesion along the scar line of previous incision. No abscess. Located mid line abdomen approximately where umbilicus would be located.   Psychiatric: She has a normal mood and affect. Her behavior is normal.          Assessment & Plan:  Marland KitchenMarland KitchenDiagnoses and all orders for this visit:  Moderate episode of recurrent major depressive disorder (HCC) -     sertraline (ZOLOFT) 50 MG tablet; Take 1 tablet (50 mg total) by mouth daily.  Sleeping difficulties -     traZODone (DESYREL) 50 MG tablet; TAKE ONE TO TWO TABLETS BY MOUTH AT BEDTIME AS NEEDED FOR SLEEP  Need for Tdap vaccination -     Tdap vaccine greater than or equal to 7yo IM  Need for shingles vaccine -     Varicella-zoster vaccine IM (Shingrix)  Cellulitis, abdominal wall -     doxycycline (VIBRA-TABS) 100 MG tablet; Take 1 tablet (100 mg total) by mouth 2 (two) times daily. For 10 days.  Allergic conjunctivitis of both eyes -  azelastine (OPTIVAR) 0.05 % ophthalmic solution; Place 1 drop into both eyes 2 (two) times daily.    Dep[facility] 06/09/2016 01/13/2016 01/13/2016 09/30/2015  Decreased Interest 0 0 0 1  Down, Depressed, Hopeless 1 0 0 1  PHQ - 2 Score 1 0 0 2  Altered sleeping 2 2 2 3   Tired, decreased energy 1 1 1 1   Change in appetite 1 1 1 1   Feeling bad or failure about yourself  1 0 0 2  Trouble concentrating 2 1 1 2   Moving slowly or fidgety/restless 1 0 0 1  Suicidal thoughts 0 0 0 0  PHQ-9 Score 9 5 5 12    . GAD 7 : Generalized  Anxiety Score 06/09/2016 01/13/2016 09/30/2015  Nervous, Anxious, on Edge 1 1 1   Control/stop worrying 0 1 1  Worry too much - different things 0 1 2  Trouble relaxing 1 2 2   Restless 1 1 1   Easily annoyed or irritable 0 0 1  Afraid - awful might happen 0 0 1  Total GAD 7 Score 3 6 9   Anxiety Difficulty Not difficult at all - Somewhat difficult   Will get nurse to call and see if cologuard is covered by medicaid. She does not want to do colonoscopy.

## 2016-06-10 NOTE — Telephone Encounter (Signed)
Can we find out if cologuard is covered by medicaid. I was pretty sure they did and patient states they do not. I really think she needs this test.

## 2016-06-10 NOTE — Telephone Encounter (Signed)
Call to get latest a1c information at Dr. Shawnee KnappLevy for EMR.

## 2016-06-11 ENCOUNTER — Encounter: Payer: Self-pay | Admitting: Physician Assistant

## 2016-06-11 NOTE — Telephone Encounter (Signed)
Most recent a1c was 6.7 on 03/31/16. Will add to EMR.

## 2016-06-14 ENCOUNTER — Other Ambulatory Visit: Payer: Self-pay | Admitting: Physician Assistant

## 2016-06-15 ENCOUNTER — Telehealth: Payer: Self-pay | Admitting: *Deleted

## 2016-06-15 MED ORDER — OLOPATADINE HCL 0.1 % OP SOLN: 1.0000 [drp] | Freq: Two times a day (BID) | OPHTHALMIC | 11 refills | Status: DC

## 2016-06-15 NOTE — Telephone Encounter (Signed)
Patanol sent in.

## 2016-06-15 NOTE — Telephone Encounter (Signed)
Patient has medicaid. gen optivar most likely will be denied. The preferred eye drops are generic patanol and generic pataday. If appropriate please send an alternative to her pharmacy. This is Jade's patient but routing since she is now out of the office.

## 2016-06-25 ENCOUNTER — Other Ambulatory Visit: Payer: Self-pay | Admitting: Physician Assistant

## 2016-07-03 ENCOUNTER — Other Ambulatory Visit: Payer: Self-pay | Admitting: Physician Assistant

## 2016-07-13 ENCOUNTER — Ambulatory Visit: Payer: Medicaid Other | Admitting: Physician Assistant

## 2017-01-13 LAB — COLOGUARD

## 2017-01-18 ENCOUNTER — Other Ambulatory Visit: Payer: Self-pay | Admitting: Physician Assistant

## 2017-01-20 LAB — HEPATIC FUNCTION PANEL
ALT: 30 (ref 7–35)
AST: 30 (ref 13–35)
Alkaline Phosphatase: 74 (ref 25–125)
Bilirubin, Total: 0.7

## 2017-01-20 LAB — HEMOGLOBIN A1C: Hemoglobin A1C: 7.1

## 2017-01-20 LAB — BASIC METABOLIC PANEL
BUN: 16 (ref 4–21)
CREATININE: 0.7 (ref 0.5–1.1)
Glucose: 169
POTASSIUM: 4.4 (ref 3.4–5.3)
SODIUM: 138 (ref 137–147)

## 2017-02-09 DIAGNOSIS — K861 Other chronic pancreatitis: Secondary | ICD-10-CM | POA: Diagnosis not present

## 2017-02-25 ENCOUNTER — Other Ambulatory Visit: Payer: Self-pay | Admitting: *Deleted

## 2017-02-25 ENCOUNTER — Other Ambulatory Visit: Payer: Self-pay | Admitting: Physician Assistant

## 2017-03-13 ENCOUNTER — Other Ambulatory Visit: Payer: Self-pay | Admitting: Physician Assistant

## 2017-03-13 DIAGNOSIS — G479 Sleep disorder, unspecified: Secondary | ICD-10-CM

## 2017-03-24 ENCOUNTER — Other Ambulatory Visit: Payer: Self-pay

## 2017-04-17 ENCOUNTER — Other Ambulatory Visit: Payer: Self-pay | Admitting: Osteopathic Medicine

## 2017-04-27 ENCOUNTER — Other Ambulatory Visit: Payer: Self-pay | Admitting: Physician Assistant

## 2017-04-27 DIAGNOSIS — G479 Sleep disorder, unspecified: Secondary | ICD-10-CM

## 2017-04-30 ENCOUNTER — Other Ambulatory Visit: Payer: Self-pay

## 2017-04-30 MED ORDER — ALBUTEROL SULFATE HFA 108 (90 BASE) MCG/ACT IN AERS: 2.0000 | INHALATION_SPRAY | Freq: Four times a day (QID) | RESPIRATORY_TRACT | 3 refills | Status: DC | PRN

## 2017-04-30 NOTE — Progress Notes (Signed)
Rx changed due to Proventil being on backorder.

## 2017-05-23 ENCOUNTER — Other Ambulatory Visit: Payer: Self-pay | Admitting: Physician Assistant

## 2017-06-09 ENCOUNTER — Encounter: Payer: Self-pay | Admitting: Physician Assistant

## 2017-06-09 ENCOUNTER — Ambulatory Visit (INDEPENDENT_AMBULATORY_CARE_PROVIDER_SITE_OTHER): Payer: Medicaid Other | Admitting: Physician Assistant

## 2017-06-09 VITALS — BP 111/60 | HR 72 | Ht 68.0 in | Wt 113.0 lb

## 2017-06-09 DIAGNOSIS — E1065 Type 1 diabetes mellitus with hyperglycemia: Secondary | ICD-10-CM | POA: Diagnosis not present

## 2017-06-09 DIAGNOSIS — G479 Sleep disorder, unspecified: Secondary | ICD-10-CM | POA: Diagnosis not present

## 2017-06-09 DIAGNOSIS — K861 Other chronic pancreatitis: Secondary | ICD-10-CM

## 2017-06-09 DIAGNOSIS — E785 Hyperlipidemia, unspecified: Secondary | ICD-10-CM | POA: Diagnosis not present

## 2017-06-09 DIAGNOSIS — K5900 Constipation, unspecified: Secondary | ICD-10-CM

## 2017-06-09 DIAGNOSIS — E1043 Type 1 diabetes mellitus with diabetic autonomic (poly)neuropathy: Secondary | ICD-10-CM | POA: Diagnosis not present

## 2017-06-09 DIAGNOSIS — F411 Generalized anxiety disorder: Secondary | ICD-10-CM | POA: Diagnosis not present

## 2017-06-09 DIAGNOSIS — H1013 Acute atopic conjunctivitis, bilateral: Secondary | ICD-10-CM | POA: Diagnosis not present

## 2017-06-09 DIAGNOSIS — Z79899 Other long term (current) drug therapy: Secondary | ICD-10-CM

## 2017-06-09 DIAGNOSIS — F331 Major depressive disorder, recurrent, moderate: Secondary | ICD-10-CM | POA: Diagnosis not present

## 2017-06-09 DIAGNOSIS — IMO0002 Reserved for concepts with insufficient information to code with codable children: Secondary | ICD-10-CM

## 2017-06-09 MED ORDER — TRAZODONE HCL 50 MG PO TABS: ORAL_TABLET | ORAL | 1 refills | Status: DC

## 2017-06-09 MED ORDER — SERTRALINE HCL 100 MG PO TABS: 100.0000 mg | ORAL_TABLET | Freq: Every day | ORAL | 1 refills | Status: DC

## 2017-06-09 NOTE — Progress Notes (Signed)
Subjective:     Patient ID: Valerie Pratt, female   DOB: [birth year], 52 y.o.   MRN: [id number]  HPI Pt is a 52 yo female with type 1 diabetes, MDD, and chronic pancreatitis who presents to the clinic for follow up and refills.  DM - She is followed by endocrinology and reports last A1c to be 7.1. Her next appointment is next month.  MDD - She reports increased stress, anxiety, and sadness and wants to discuss possibly adjusting her zoloft. She admits to some new life stress due to her daughter's drug abuse. Her daughter had a baby this past year who she had custody of for a period of time. She is now unable to see the baby very frequently. She admits to difficulty sleeping. She uses trazadone and melatonin which helps her fall asleep however she wakes up approximately twice during the night. She describes waking up as "jolting awake."  She is having a procedure for her pancreas on 07/02/17 which is being coordinated by gastroenterology.  Allergies - She states she still experiences itchy watery eyes and was unable to get eye drops due to them not being covered by insurance.  GI - She has experienced increased gas and irregularity of bowel movements and states her hernias have enlarged. She denies symptoms of strangulation or incarceration. She has not had a colonoscopy but had cologuard done in Jan 2019(negative).   .. Active Ambulatory Problems    Diagnosis Date Noted  . History of pancreatitis 05/29/2015  . Constipation 05/29/2015  . Cough 05/29/2015  . Unintentional weight loss 05/29/2015  . Elevated random blood glucose level 05/29/2015  . Type I diabetes mellitus (HCC) 06/04/2015  . 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 Current smoker 08/26/2015  . Chronic obstructive pulmonary disease (HCC) 08/26/2015  . Depression 08/26/2015  . Former smoker 09/30/2015  . Sleeping difficulties 09/30/2015  . GAD (generalized anxiety  disorder) 12/16/2015  . Breast cancer screening 01/13/2016  . Screening for lipid disorders 01/13/2016  . Colon cancer screening 01/13/2016  . Nuclear sclerotic cataract of both eyes 01/15/2016  . Chronic pancreatitis (HCC) 06/10/2017  . Dyslipidemia, goal LDL below 70 06/10/2017   Resolved Ambulatory Problems    Diagnosis Date Noted  . Excessive thirst 05/29/2015   No Additional Past Medical History      Review of Systems  All other systems reviewed and are negative.      Objective:   Physical Exam  Constitutional: She is oriented to person, place, and time. She appears well-developed and well-nourished. No distress.  HENT:  Head: Normocephalic and atraumatic.  Right Ear: External ear normal.  Left Ear: External ear normal.  Eyes: Pupils are equal, round, and reactive to light. Conjunctivae are normal. Right eye exhibits no discharge. Left eye exhibits no discharge.  Cardiovascular: Normal rate, regular rhythm and normal heart sounds. Exam reveals no gallop and no friction rub.  No murmur heard. Pulmonary/Chest: Effort normal and breath sounds normal.  Abdominal: Soft. A hernia (soft and easily reduced) is present.  Neurological: She is alert and oriented to person, place, and time.  Skin: Skin is warm and dry. She is not diaphoretic.  Psychiatric: She has a normal mood and affect.       Assessment:    Marland KitchenMarland KitchenDiagnoses and all orders for this visit:  Moderate episode of recurrent major depressive disorder (HCC) -     sertraline (ZOLOFT) 100 MG tablet; Take 1 tablet (100 mg total) by  mouth daily.  Sleeping difficulties -     traZODone (DESYREL) 50 MG tablet; TAKE 1 TO 2 TABLETS BY MOUTH AT BEDTIME AS NEEDED FOR SLEEP.  Dyslipidemia, goal LDL below 70 -     Lipid Panel w/reflex Direct LDL  Medication management -     COMPLETE METABOLIC PANEL WITH GFR  Constipation, unspecified constipation type  GAD (generalized anxiety disorder) -     sertraline (ZOLOFT) 100 MG  tablet; Take 1 tablet (100 mg total) by mouth daily.  Chronic pancreatitis, unspecified pancreatitis type (HCC)  Allergic conjunctivitis of both eyes -     azelastine (OPTIVAR) 0.05 % ophthalmic solution; Place 1 drop into both eyes 2 (two) times daily.  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 GAD 7 : Generalized Anxiety Score 06/09/2017 06/09/2016 01/13/2016 09/30/2015  Nervous, Anxious, on Edge 3 1 1 1   Control/stop worrying 3 0 1 1  Worry too much - different things 3 0 1 2  Trouble relaxing 2 1 2 2   Restless 2 1 1 1   Easily annoyed or irritable 2 0 0 1  Afraid - awful might happen 2 0 0 1  Total GAD 7 Score 17 3 6 9   Anxiety Difficulty Very difficult Not difficult at all - Somewhat difficult   .Marland Kitchen. Depression screen [facility] 2/9 06/09/2017 06/09/2016 01/13/2016 01/13/2016 09/30/2015  Decreased Interest 2 0 0 0 1  Down, Depressed, Hopeless 3 1 0 0 1  PHQ - 2 Score 5 1 0 0 2  Altered sleeping 3 2 2 2 3   Tired, decreased energy 2 1 1 1 1   Change in appetite 1 1 1 1 1   Feeling bad or failure about yourself  2 1 0 0 2  Trouble concentrating 1 2 1 1 2   Moving slowly or fidgety/restless 1 1 0 0 1  Suicidal thoughts 0 0 0 0 0  PHQ-9 Score 15 9 5 5 12   Difficult doing work/chores Somewhat difficult - - - -       Plan:     Will increase zoloft to 100mg . Discussed counseling. Continue using medication/exercise to help cope with the difficult situations she is encountering. Continue to work on sleep with trazodone and good sleep hygiene.   DM is managed by endocrinology. Will call to get information abstracted into system.   I feel like patient could be having some IBS constipation causing her systems. This could be complicated by DM.  Trial of elimination diet - 2 weeks eliminating gluten, 2 weeks eliminating dairy. Probiotics daily and miralax as needed. Could consider amitza/linzess in future.   optivar sent to see if works for allergic conjunctivitis.    Follow up in 3 months.   Marland Kitchen..Spent 30 minutes with patient and greater than 50 percent of visit spent counseling patient regarding treatment plan.

## 2017-06-09 NOTE — Patient Instructions (Addendum)
Probiotic/food elimination.  miralax 1 capful as needed.   Increased zoloft to 100mg  daily.

## 2017-06-10 ENCOUNTER — Other Ambulatory Visit: Payer: Self-pay | Admitting: Physician Assistant

## 2017-06-10 ENCOUNTER — Telehealth: Payer: Self-pay | Admitting: Physician Assistant

## 2017-06-10 ENCOUNTER — Encounter: Payer: Self-pay | Admitting: Physician Assistant

## 2017-06-10 DIAGNOSIS — E785 Hyperlipidemia, unspecified: Secondary | ICD-10-CM | POA: Insufficient documentation

## 2017-06-10 DIAGNOSIS — K861 Other chronic pancreatitis: Secondary | ICD-10-CM | POA: Insufficient documentation

## 2017-06-10 MED ORDER — AZELASTINE HCL 0.05 % OP SOLN: 1.0000 [drp] | Freq: Two times a day (BID) | OPHTHALMIC | 11 refills | Status: DC

## 2017-06-10 NOTE — Telephone Encounter (Signed)
Can we please get her DM info from endocrinologist and abstracted in?

## 2017-06-10 NOTE — Telephone Encounter (Signed)
Document printed with last set of lab work.

## 2017-06-16 ENCOUNTER — Encounter: Payer: Self-pay | Admitting: Physician Assistant

## 2017-06-17 LAB — LIPID PANEL W/REFLEX DIRECT LDL
Cholesterol: 150 mg/dL (ref <200)
HDL: 75 mg/dL (ref >50)
LDL Cholesterol (Calc): 62 mg/dL (calc)
NON-HDL CHOLESTEROL (CALC): 75 mg/dL (ref <130)
Total CHOL/HDL Ratio: 2 (calc) (ref <5.0)
Triglycerides: 54 mg/dL (ref <150)

## 2017-06-17 LAB — COMPLETE METABOLIC PANEL WITH GFR
AG RATIO: 1.8 (calc) (ref 1.0–2.5)
ALBUMIN MSPROF: 4.7 g/dL (ref 3.6–5.1)
ALT: 19 U/L (ref 6–29)
AST: 23 U/L (ref 10–35)
Alkaline phosphatase (APISO): 73 U/L (ref 33–130)
BUN: 14 mg/dL (ref 7–25)
CALCIUM: 9.8 mg/dL (ref 8.6–10.4)
CO2: 32 mmol/L (ref 20–32)
Chloride: 104 mmol/L (ref 98–110)
Creat: 0.63 mg/dL (ref 0.50–1.05)
GFR, EST AFRICAN AMERICAN: 119 mL/min/{1.73_m2} (ref >=60)
GFR, Est Non African American: 102 mL/min/{1.73_m2} (ref >=60)
Globulin: 2.6 g/dL (calc) (ref 1.9–3.7)
Glucose, Bld: 116 mg/dL — ABNORMAL HIGH (ref 65–99)
POTASSIUM: 4.8 mmol/L (ref 3.5–5.3)
Sodium: 142 mmol/L (ref 135–146)
TOTAL PROTEIN: 7.3 g/dL (ref 6.1–8.1)
Total Bilirubin: 1 mg/dL (ref 0.2–1.2)

## 2017-06-17 NOTE — Progress Notes (Signed)
Pt has seen results on MyChart and message also sent for patient to call back if any questions.

## 2017-06-17 NOTE — Progress Notes (Signed)
Call pt: cholesterol looks fantastic. Kidney and liver look great.

## 2017-07-02 ENCOUNTER — Encounter: Payer: Self-pay | Admitting: Physician Assistant

## 2017-07-21 DIAGNOSIS — E1065 Type 1 diabetes mellitus with hyperglycemia: Secondary | ICD-10-CM | POA: Diagnosis not present

## 2017-08-14 DIAGNOSIS — Y999 Unspecified external cause status: Secondary | ICD-10-CM | POA: Diagnosis not present

## 2017-08-14 DIAGNOSIS — W268XXA Contact with other sharp object(s), not elsewhere classified, initial encounter: Secondary | ICD-10-CM | POA: Diagnosis not present

## 2017-08-14 DIAGNOSIS — S51812A Laceration without foreign body of left forearm, initial encounter: Secondary | ICD-10-CM | POA: Diagnosis not present

## 2017-08-25 ENCOUNTER — Telehealth: Payer: Self-pay | Admitting: Emergency Medicine

## 2017-08-25 ENCOUNTER — Emergency Department (INDEPENDENT_AMBULATORY_CARE_PROVIDER_SITE_OTHER)
Admission: EM | Admit: 2017-08-25 | Discharge: 2017-08-25 | Disposition: A | Discharge status: Home / Self Care | Payer: Medicaid Other | Source: Home / Self Care | Attending: Emergency Medicine | Admitting: Emergency Medicine

## 2017-08-25 ENCOUNTER — Encounter: Payer: Self-pay | Admitting: Emergency Medicine

## 2017-08-25 ENCOUNTER — Other Ambulatory Visit: Payer: Self-pay

## 2017-08-25 DIAGNOSIS — T148XXA Other injury of unspecified body region, initial encounter: Secondary | ICD-10-CM

## 2017-08-25 DIAGNOSIS — M795 Residual foreign body in soft tissue: Secondary | ICD-10-CM | POA: Diagnosis not present

## 2017-08-25 DIAGNOSIS — L089 Local infection of the skin and subcutaneous tissue, unspecified: Secondary | ICD-10-CM

## 2017-08-25 DIAGNOSIS — T8130XA Disruption of wound, unspecified, initial encounter: Secondary | ICD-10-CM | POA: Diagnosis not present

## 2017-08-25 HISTORY — DX: Type 2 diabetes mellitus without complications: E11.9

## 2017-08-25 HISTORY — DX: Insomnia, unspecified: G47.00

## 2017-08-25 HISTORY — DX: Chronic obstructive pulmonary disease, unspecified: J44.9

## 2017-08-25 HISTORY — DX: Anxiety disorder, unspecified: F41.9

## 2017-08-25 MED ORDER — CLINDAMYCIN HCL 300 MG PO CAPS: 300.0000 mg | ORAL_CAPSULE | Freq: Three times a day (TID) | ORAL | 0 refills | Status: DC

## 2017-08-25 NOTE — Telephone Encounter (Signed)
Encounter opened to add xray left forearm on 08/27/17 prior to visit with C.Cummings.

## 2017-08-25 NOTE — Discharge Instructions (Addendum)
Keep the open area clean with soap and water 3-4 times a day.Take Cleocin three times a day. We will call you with culture results

## 2017-08-25 NOTE — ED Triage Notes (Signed)
Patient's FBG this morning 164.

## 2017-08-25 NOTE — ED Provider Notes (Signed)
[facility]    CSN: [id number] Arrival date & time: 08/25/17  1019     History   Chief Complaint Chief Complaint  Patient presents with  . Wound Check  Patient suffered a laceration to her left forearm 11 days ago. She was seen in the emergency room at [facility]. Her tetanus was up-to-date. She had repair of her laceration She was placed on Cleocin 300 mg 3 times a day to prevent infection. She finished this medication 3 days ago. She has had a swollen tender area of the wound ulnar side of her left forearm. She enters today to see about suture removal. She denies any fever or chills or other symptoms.Her sugars have been running high.I searched care everywhere and could not find the note from [facility] regarding the wound repair.  HPI Valerie Pratt is a 52 y.o. female.   HPI  Past Medical History:  Diagnosis Date  . Anxiety   . COPD (chronic obstructive pulmonary disease) (HCC)   . Diabetes mellitus without complication (HCC)   . Insomnia     Patient Active Problem List   Diagnosis Date Noted  . Chronic pancreatitis (HCC) 06/10/2017  . Dyslipidemia, goal LDL below 70 06/10/2017  . Nuclear sclerotic cataract of both eyes 01/15/2016  . Breast cancer screening 01/13/2016  . Screening for lipid disorders 01/13/2016  . Colon cancer screening 01/13/2016  . GAD (generalized anxiety disorder) 12/16/2015  . Former smoker 09/30/2015  . Sleeping difficulties 09/30/2015  . Current smoker 08/26/2015  . Chronic obstructive pulmonary disease (HCC) 08/26/2015  . Depression 08/26/2015  . 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 Type I diabetes mellitus (HCC) 06/04/2015  . History of pancreatitis 05/29/2015  . Constipation 05/29/2015  . Cough 05/29/2015  . Unintentional weight loss 05/29/2015  . Elevated random blood glucose level 05/29/2015    History reviewed. No pertinent surgical  history.  OB History   None      Home Medications    Prior to Admission medications   Medication Sig Start Date End Date Taking? Authorizing Provider  ACCU-CHEK AVIVA PLUS test strip USE ONE STRIP TO CHECK GLUCOSE 4 TIMES DAILY 06/26/16   Breeback, Jade L, PA-C  acetaminophen (TYLENOL) 500 MG tablet Take by mouth.    [provider]  albuterol (PROVENTIL HFA;VENTOLIN HFA) 108 (90 Base) MCG/ACT inhaler Inhale 2 puffs into the lungs every 6 (six) hours as needed for wheezing or shortness of breath. 04/30/17   Agapito Games, MD  azelastine (OPTIVAR) 0.05 % ophthalmic solution Place 1 drop into both eyes 2 (two) times daily. 06/10/17   Breeback, Lonna Cobb, PA-C  clindamycin (CLEOCIN) 300 MG capsule Take 1 capsule (300 mg total) by mouth 3 (three) times daily. 08/25/17   Collene Gobble, MD  insulin aspart (NOVOLOG) 100 UNIT/ML FlexPen Inject into the skin. 10/02/15 10/01/16  [provider]  Insulin Glargine (LANTUS) 100 UNIT/ML Solostar Pen Inject into the skin. 10/02/15   [provider]  insulin NPH-regular Human (NOVOLIN 70/30) (70-30) 100 UNIT/ML injection Inject into the skin. 08/26/15   [provider]  Insulin Pen Needle (PEN NEEDLES) 32G X 4 MM MISC by Does not apply route. 10/03/15   [provider]  Lancets (ACCU-CHEK MULTICLIX) lancets USE ONE LANCET TO CHECK GLUCOSE 4 TIMES DAILY 06/26/16   Breeback, Jade L, PA-C  pravastatin (PRAVACHOL) 20 MG tablet Take 1 tablet (20 mg total) by mouth daily for  15 days. Must make appointment before any further refills. 05/24/17 06/08/17  Breeback, Lesly RubensteinJade L, PA-C  pravastatin (PRAVACHOL) 20 MG tablet Take 1 tablet (20 mg total) by mouth daily. 06/10/17   Breeback, Jade L, PA-C  sertraline (ZOLOFT) 100 MG tablet Take 1 tablet (100 mg total) by mouth daily. 06/09/17   Breeback, Jade L, PA-C  traZODone (DESYREL) 50 MG tablet TAKE 1 TO 2 TABLETS BY MOUTH AT BEDTIME AS NEEDED FOR SLEEP. 06/09/17   Jomarie LongsBreeback, Jade L, PA-C     Family History Family History  Problem Relation Age of Onset  . Cancer Mother        bladder  . Cancer Father        leukemia    Social History Social History   Tobacco Use  . Smoking status: Former Smoker    Types: Cigarettes    Last attempt to quit: 09/15/2015    Years since quitting: 1.9  . Smokeless tobacco: Never Used  Substance Use Topics  . Alcohol use: Not Currently    Alcohol/week: 0.0 standard drinks  . Drug use: No     Allergies   Penicillins   Review of Systems Review of Systems  Constitutional: Negative.   Gastrointestinal: Negative.  Negative for diarrhea.  Endocrine:       Patient is diabetic on insulin with sugars running high recently.     Physical Exam Triage Vital Signs ED Triage Vitals  Enc Vitals Group     BP 08/25/17 1042 117/71     Pulse Rate 08/25/17 1042 72     Resp 08/25/17 1042 16     Temp 08/25/17 1042 98.4 F (36.9 C)     Temp Source 08/25/17 1042 Oral     SpO2 08/25/17 1042 96 %     Weight 08/25/17 1043 112 lb (50.8 kg)     Height 08/25/17 1043 5' 7.5" (1.715 m)     Head Circumference --      Peak Flow --      Pain Score 08/25/17 1043 4     Pain Loc --      Pain Edu? --      Excl. in GC? --    No data found.  Updated Vital Signs BP 117/71 (BP Location: Right Arm)   Pulse 72   Temp 98.4 F (36.9 C) (Oral)   Resp 16   Ht 5' 7.5" (1.715 m)   Wt 50.8 kg   SpO2 96%   BMI 17.28 kg/m   Visual Acuity Right Eye Distance:   Left Eye Distance:   Bilateral Distance:    Right Eye Near:   Left Eye Near:    Bilateral Near:     Physical Exam  Skin:  There is a Y-shaped four-inch laceration involving the left forearm. The central laceration  and one portion of the Y shaped  on the radial side of the forearm do not appear infected. On the ulnar side of the distal laceration there is approximately 3 x 3 cm of induration with redness.There is no purulence from this area.This area is only mildly tender.the margins of the  laceration are not approximated well.  Examination of the left hand and wrist was normal with good blood flow and sensation.   UC Treatments / Results  Labs (all labs ordered are listed, but only abnormal results are displayed) Labs Reviewed  WOUND CULTURE    EKG None  Radiology No results found.  Procedures Procedures (including critical care time is a  Procedure. Wound site was prepped with Betadine. All sutures were removed. Some purulence was noted. There is a wound dehiscence  the ulnar side of the laceration.There were #2  2 mm black foreign bodies  removed. There was  no odor to the wound margins. . Because the wound margins had dehisced I left this open. The other sutures were removed and Steri-Stripped.Culture was sent.  Medications Ordered in UC Medications - No data to display  Initial Impression / Assessment and Plan / UC Course  I have reviewed the triage vital signs and the nursing notes. Patient suffered a deep wound to the left forearm. This was surgically repaired. On removal of the stitches there were 2 black foreign bodies removed with what  appeared to be debris from the initial injury. There is also some purulence noted at the central  tip of the wound. Culture was done of this area. All the sutures were removed. The area where the foreign bodies were removed showed induration and nonhealing of the margins. Because of this, this area was left open.She will clean this area followed by dry dressing. I placed her back on Cleocin which she has been tolerating but advised her about the risk of C. Difficile. Culture was done. All questions were answered. [facility].Would  consider a plain film of the forearm or ultrasound if wound is not healing.I did call patient and advise her to have US or plain Xray if not healing Pertinent labs & imaging results that were available during my care of the patient were  reviewed by me and considered in my medical decision making (see chart for details).      Final Clinical Impressions(s) / UC Diagnoses   Final diagnoses:  Wound dehiscence  Foreign body left in wound  Wound infection     Discharge Instructions     Keep the open area clean with soap and water 3-4 times a day.Take Cleocin three times a day. We will call you with culture results    ED Prescriptions    Medication Sig Dispense Auth. Provider   clindamycin (CLEOCIN) 300 MG capsule Take 1 capsule (300 mg total) by mouth 3 (three) times daily. 15 capsule Collene Gobbleaub, Steven A, MD     Controlled Substance Prescriptions [place] Controlled Substance Registry consulted? Not Applicable   Collene Gobbleaub, Steven A, MD 08/25/17 1431

## 2017-08-25 NOTE — ED Triage Notes (Signed)
Patient reports lacerating her left forearm on rusty mailbox 08/14/17; received over 20 sutures to area; Tdap was up to date; has completed course of Clindamycin. She now feels the area is infected. No known fever at home; no OTCs today. She is type 2 DM.

## 2017-08-25 NOTE — Telephone Encounter (Signed)
Spoke with patient and told her order for xray of arm has been placed for 08/27/17; she should present at least one hour before appt. With C.Cummings so she will have results. pk

## 2017-08-26 ENCOUNTER — Other Ambulatory Visit: Payer: Self-pay | Admitting: Family Medicine

## 2017-08-26 ENCOUNTER — Telehealth: Payer: Self-pay

## 2017-08-26 NOTE — Telephone Encounter (Signed)
Verbal from Dr Cleta Albertsaub:  Discussed concern re: foreign body with pt. She will have xrays tomorrow prior to visit with Gena Frayharley Cummings PA. Discussed situation with Gena Frayharley Cummings. Further eval after the xrays at her appt time. (1:40pm)

## 2017-08-27 ENCOUNTER — Ambulatory Visit (INDEPENDENT_AMBULATORY_CARE_PROVIDER_SITE_OTHER): Payer: Medicaid Other

## 2017-08-27 ENCOUNTER — Ambulatory Visit (INDEPENDENT_AMBULATORY_CARE_PROVIDER_SITE_OTHER): Payer: Medicaid Other | Admitting: Sports Medicine

## 2017-08-27 ENCOUNTER — Encounter: Payer: Self-pay | Admitting: Physician Assistant

## 2017-08-27 VITALS — BP 123/73 | HR 67 | Temp 98.2°F | Wt 111.0 lb

## 2017-08-27 DIAGNOSIS — S51812S Laceration without foreign body of left forearm, sequela: Secondary | ICD-10-CM | POA: Diagnosis not present

## 2017-08-27 DIAGNOSIS — X58XXXA Exposure to other specified factors, initial encounter: Secondary | ICD-10-CM | POA: Diagnosis not present

## 2017-08-27 DIAGNOSIS — T8133XA Disruption of traumatic injury wound repair, initial encounter: Secondary | ICD-10-CM | POA: Insufficient documentation

## 2017-08-27 DIAGNOSIS — S51812A Laceration without foreign body of left forearm, initial encounter: Secondary | ICD-10-CM

## 2017-08-27 DIAGNOSIS — T8133XD Disruption of traumatic injury wound repair, subsequent encounter: Secondary | ICD-10-CM

## 2017-08-27 MED ORDER — DOXYCYCLINE HYCLATE 100 MG PO TABS: 100.0000 mg | ORAL_TABLET | Freq: Two times a day (BID) | ORAL | 0 refills | Status: AC

## 2017-08-27 MED ORDER — HYDROCODONE-ACETAMINOPHEN 5-325 MG PO TABS: 1.0000 | ORAL_TABLET | Freq: Three times a day (TID) | ORAL | 0 refills | Status: DC | PRN

## 2017-08-27 NOTE — Progress Notes (Signed)
Subjective:    I'm seeing this patient as a consultation for: Gena Fray, PA-C, Tandy Gaw, PA-C, Dr. Elgie Congo  CC: Complex laceration forearm  HPI: 13 days ago this pleasant 52 year old female injured her left forearm.  She had a complex laceration that was repaired in the emergency department.  She did have what appeared to be some wound dehiscence, as well as low-grade infection.  She was placed on Cleocin, and she is here for further evaluation.  Does have pain, mild, persistent.  Localized without radiation, no constitutional symptoms.  I reviewed the past medical history, family history, social history, surgical history, and allergies today and no changes were needed.  Please see the problem list section below in epic for further details.  Past Medical History: Past Medical History:  Diagnosis Date  . Anxiety   . COPD (chronic obstructive pulmonary disease) (HCC)   . Diabetes mellitus without complication (HCC)   . Insomnia    Past Surgical History: History reviewed. No pertinent surgical history. Social History: Social History   Socioeconomic History  . Marital status: Legally Separated    Spouse name: Not on file  . Number of children: Not on file  . Years of education: Not on file  . Highest education level: Not on file  Occupational History  . Not on file  Social Needs  . Financial resource strain: Not on file  . Food insecurity:    Worry: Not on file    Inability: Not on file  . Transportation needs:    Medical: Not on file    Non-medical: Not on file  Tobacco Use  . Smoking status: Former Smoker    Types: Cigarettes    Last attempt to quit: 09/15/2015    Years since quitting: 1.9  . Smokeless tobacco: Never Used  Substance and Sexual Activity  . Alcohol use: Not Currently    Alcohol/week: 0.0 standard drinks  . Drug use: No  . Sexual activity: Never  Lifestyle  . Physical activity:    Days per week: Not on file    Minutes per session:  Not on file  . Stress: Not on file  Relationships  . Social connections:    Talks on phone: Not on file    Gets together: Not on file    Attends religious service: Not on file    Active member of club or organization: Not on file    Attends meetings of clubs or organizations: Not on file    Relationship status: Not on file  Other Topics Concern  . Not on file  Social History Narrative  . Not on file   Family History: Family History  Problem Relation Age of Onset  . Cancer Mother        bladder  . Cancer Father        leukemia   Allergies: Allergies  Allergen Reactions  . Penicillins     RASH   Medications: See med rec.  Review of Systems: No headache, visual changes, nausea, vomiting, diarrhea, constipation, dizziness, abdominal pain, skin rash, fevers, chills, night sweats, weight loss, swollen lymph nodes, body aches, joint swelling, muscle aches, chest pain, shortness of breath, mood changes, visual or auditory hallucinations.   Objective:   General: Well Developed, well nourished, and in no acute distress.  Neuro:  Extra-ocular muscles intact, able to move all 4 extremities, sensation grossly intact.  Deep tendon reflexes tested were normal. Psych: Alert and oriented, mood congruent with affect. ENT:  Ears  and nose appear unremarkable.  Hearing grossly normal. Neck: Unremarkable overall appearance, trachea midline.  No visible thyroid enlargement. Eyes: Conjunctivae and lids appear unremarkable.  Pupils equal and round. Skin: Warm and dry, no rashes noted.  Cardiovascular: Pulses palpable, no extremity edema. Left forearm: Complex laceration, 5-6 cm.    Laceration repair:  Indication: bleeding Location: Left forearm, through skin, subcutaneous tissue to muscle fascia. Size: 5 to 6 cm Anesthesia: 1%lidocaine with epi, good effect Wound explored, irrigated, no visible foreign bodies, laceration does go through the skin, sub-cutaneous tissues to the deep muscle  fascia, no breach of the muscle fascia or tendon injury. Some of the edges of the wound flap were devitalized, these were trimmed, I undermined the distal edges of the incision as well with dissecting scissors to decrease tension across the suture line. Type of suture material: I then placed #7 3-0 simple interrupted Ethilon sutures to close the incision. Tolerated well Routine postprocedure instructions d/w pt- keep area clean and bandaged, follow up if concerns/spreading erythema/pain.   Follow up for for suture removal.  Impression and Recommendations:   This case required medical decision making of moderate complexity.  Forearm laceration with complication, left, sequela Wound revision with secondary closure after copious irrigation and cleaning with chlorhexidine, and removal of devitalized tissue and devitalized wound edges. Hydrocodone for postoperative pain, doxycycline, return to see me in 1 week. Daily dressing changes. ___________________________________________ Ihor Austinhomas J. Benjamin Stainhekkekandam, M.D., ABFM., CAQSM. Primary Care and Sports Medicine  [facility]  Adjunct Instructor of Family Medicine  [employer]

## 2017-08-27 NOTE — Progress Notes (Signed)
HPI:                                                                Valerie Pratt is a 52 y.o. female who presents to [facility]: Primary Care Sports Medicine today for wound check left forearm  Large laceration of left forearm sustained on a rusty mailbox 13 days ago. Laceration was repaired and she was placed on Cleocin.  Sutures were removed in urgent care 2 days ago. There were 2 black foreign bodies removed and the clinician noted that there was dehiscence of the wound with some induration and purulence at that time. She was placed on an additional round of Cleocin. She continues to report yellowish watery drainage from the wound and pain.  Denies fevers, chills. History significant for type 2 diabetes.   Past Medical History:  Diagnosis Date  . Anxiety   . COPD (chronic obstructive pulmonary disease) (HCC)   . Diabetes mellitus without complication (HCC)   . Insomnia    No past surgical history on file. Social History   Tobacco Use  . Smoking status: Former Smoker    Types: Cigarettes    Last attempt to quit: 09/15/2015    Years since quitting: 1.9  . Smokeless tobacco: Never Used  Substance Use Topics  . Alcohol use: Not Currently    Alcohol/week: 0.0 standard drinks   family history includes Cancer in her father and mother.    ROS: negative except as noted in the HPI  Medications: Current Outpatient Medications  Medication Sig Dispense Refill  . ACCU-CHEK AVIVA PLUS test strip USE ONE STRIP TO CHECK GLUCOSE 4 TIMES DAILY 400 each 3  . acetaminophen (TYLENOL) 500 MG tablet Take by mouth.    Marland Kitchen azelastine (OPTIVAR) 0.05 % ophthalmic solution Place 1 drop into both eyes 2 (two) times daily. 6 mL 11  . clindamycin (CLEOCIN) 300 MG capsule Take 1 capsule (300 mg total) by mouth 3 (three) times daily. 15 capsule 0  . Insulin Glargine (LANTUS) 100 UNIT/ML Solostar Pen Inject into the skin.    Marland Kitchen insulin NPH-regular Human (NOVOLIN 70/30) (70-30)  100 UNIT/ML injection Inject into the skin.    . Insulin Pen Needle (PEN NEEDLES) 32G X 4 MM MISC by Does not apply route.    . Lancets (ACCU-CHEK MULTICLIX) lancets USE ONE LANCET TO CHECK GLUCOSE 4 TIMES DAILY 400 each 3  . pravastatin (PRAVACHOL) 20 MG tablet Take 1 tablet (20 mg total) by mouth daily. 30 tablet 5  . PROAIR HFA 108 (90 Base) MCG/ACT inhaler INHALE 2 PUFFS BY MOUTH EVERY 6 HOURS AS NEEDED FOR WHEEZING OR  SHORTNESS  OF  BREATH 9 each 3  . sertraline (ZOLOFT) 100 MG tablet Take 1 tablet (100 mg total) by mouth daily. 90 tablet 1  . traZODone (DESYREL) 50 MG tablet TAKE 1 TO 2 TABLETS BY MOUTH AT BEDTIME AS NEEDED FOR SLEEP. 180 tablet 1  . insulin aspart (NOVOLOG) 100 UNIT/ML FlexPen Inject into the skin.    . pravastatin (PRAVACHOL) 20 MG tablet Take 1 tablet (20 mg total) by mouth daily for 15 days. Must make appointment before any further refills. 15 tablet 0   No current facility-administered medications for this visit.    Allergies  Allergen  Reactions  . Penicillins     RASH       Objective:  BP 123/73   Pulse 67   Temp 98.2 F (36.8 C) (Oral)   Wt 111 lb (50.3 kg)   BMI 17.13 kg/m  Gen:  alert, not ill-appearing, no distress, appropriate for age HEENT: head normocephalic without obvious abnormality, conjunctiva and cornea clear, trachea midline Pulm: Normal work of breathing, normal phonation, clear to auscultation bilaterally, no wheezes, rales or rhonchi CV: Normal rate, regular rhythm, s1 and s2 distinct, no murmurs, clicks or rubs  Neuro: alert and oriented x 3, no tremor MSK: extremities atraumatic, normal gait and station Left forearm: y-shaped laceration, the ulnar aspect measures approx 4.5 cm and there is dehiscence of the borders    No results found for this or any previous visit (from the past 72 hour(s)). Dg Forearm Left  Result Date: 08/27/2017 CLINICAL DATA:  Laceration 2 weeks ago with nonhealing wound, initial encounter EXAM: LEFT  FOREARM - 2 VIEW COMPARISON:  None. FINDINGS: No acute bony abnormality is noted. Mild soft tissue irregularity is noted consistent with the given clinical history. IMPRESSION: No acute bony abnormality noted. Electronically Signed   By: Alcide CleverMark  Lukens M.D.   On: 08/27/2017 10:36      Assessment and Plan: 52 y.o. female with   .Valerie Pratt was seen today for wound check.  Diagnoses and all orders for this visit:  Traumatic wound dehiscence, subsequent encounter -     doxycycline (VIBRA-TABS) 100 MG tablet; Take 1 tablet (100 mg total) by mouth 2 (two) times daily for 7 days.  Forearm laceration with complication, left, sequela -     doxycycline (VIBRA-TABS) 100 MG tablet; Take 1 tablet (100 mg total) by mouth 2 (two) times daily for 7 days.  personally reviewed forearm x-ray today. No evidence of FB. Given persistent drainage and wound dehiscence, consulted Dr. Benjamin Stainhekkekandam for surgical exploration of the wound and debridement (see note). Antibiotic coverage broadened with Doxycycline.  Patient education and anticipatory guidance given Patient agrees with treatment plan Follow-up in 1 week for wound check or sooner as needed if symptoms worsen or fail to improve  Levonne Hubertharley E. Cummings PA-C

## 2017-08-27 NOTE — Patient Instructions (Signed)
Sutured Wound Care  Sutures are stitches that can be used to close wounds. Taking care of your wound properly can help to prevent pain and infection. It can also help your wound to heal more quickly.  How is this treated?  Wound Care  · Keep the wound clean and dry.  · If you were given a bandage (dressing), you should change it at least once per day or as directed by your health care provider. You should also change it if it becomes wet or dirty.  · Keep the wound completely dry for the first 24 hours or as directed by your health care provider. After that time, you may shower or bathe. However, make sure that the wound is not soaked in water until the sutures have been removed.  · Clean the wound one time each day or as directed by your health care provider.  ? Wash the wound with soap and water.  ? Rinse the wound with water to remove all soap.  ? Pat the wound dry with a clean towel. Do not rub the wound.  · After cleaning the wound, apply a thin layer of antibiotic ointment as directed by your health care provider. This will help to prevent infection and keep the dressing from sticking to the wound.  · Have the sutures removed as directed by your health care provider.  General Instructions  · Take or apply medicines only as directed by your health care provider.  · To help prevent scarring, make sure to cover your wound with sunscreen whenever you are outside after the sutures are removed and the wound is healed. Make sure to wear a sunscreen of at least 30 SPF.  · If you were prescribed an antibiotic medicine or ointment, finish all of it even if you start to feel better.  · Do not scratch or pick at the wound.  · Keep all follow-up visits as directed by your health care provider. This is important.  · Check your wound every day for signs of infection. Watch for:  ? Redness, swelling, or pain.  ? Fluid, blood, or pus.  · Raise (elevate) the injured area above the level of your heart while you are sitting or  lying down, if possible.  · Avoid stretching your wound.  · Drink enough fluids to keep your urine clear or pale yellow.  Contact a health care provider if:  · You received a tetanus shot and you have swelling, severe pain, redness, or bleeding at the injection site.  · You have a fever.  · A wound that was closed breaks open.  · You notice a bad smell coming from the wound.  · You notice something coming out of the wound, such as wood or glass.  · Your pain is not controlled with medicine.  · You have increased redness, swelling, or pain at the site of your wound.  · You have fluid, blood, or pus coming from your wound.  · You notice a change in the color of your skin near your wound.  · You need to change the dressing frequently due to fluid, blood, or pus draining from the wound.  · You develop a new rash.  · You develop numbness around the wound.  Get help right away if:  · You develop severe swelling around the injury site.  · Your pain suddenly increases and is severe.  · You develop painful lumps near the wound or on skin that is anywhere on your body.  ·   You have a red streak going away from your wound.  · The wound is on your hand or foot and you cannot properly move a finger or toe.  · The wound is on your hand or foot and you notice that your fingers or toes look pale or bluish.  This information is not intended to replace advice given to you by your health care provider. Make sure you discuss any questions you have with your health care provider.  Document Released: 01/30/2004 Document Revised: 05/30/2015 Document Reviewed: 08/03/2012  Elsevier Interactive Patient Education © 2017 [employer] Inc[employer]      ==============================

## 2017-08-27 NOTE — Assessment & Plan Note (Signed)
Wound revision with secondary closure after copious irrigation and cleaning with chlorhexidine, and removal of devitalized tissue and devitalized wound edges. Hydrocodone for postoperative pain, doxycycline, return to see me in 1 week. Daily dressing changes.

## 2017-08-28 LAB — WOUND CULTURE
MICRO NUMBER:: 90997065
SPECIMEN QUALITY: ADEQUATE

## 2017-08-30 ENCOUNTER — Encounter: Payer: Self-pay | Admitting: Sports Medicine

## 2017-09-03 ENCOUNTER — Encounter: Payer: Self-pay | Admitting: Sports Medicine

## 2017-09-03 ENCOUNTER — Ambulatory Visit (INDEPENDENT_AMBULATORY_CARE_PROVIDER_SITE_OTHER): Payer: Medicaid Other | Admitting: Sports Medicine

## 2017-09-03 DIAGNOSIS — T8133XD Disruption of traumatic injury wound repair, subsequent encounter: Secondary | ICD-10-CM

## 2017-09-03 NOTE — Assessment & Plan Note (Signed)
Sutures removed, benzoin, Steri-Strips and Dermabond applied to reinforce the wound, I think she is doing very well, return to see me in 2 weeks.

## 2017-09-03 NOTE — Progress Notes (Signed)
  Subjective: 7 days post complex wound revision, doing well.  Objective: General: Well-developed, well-nourished, and in no acute distress. Left forearm: Incision is clean, dry, intact, there is a small area at the distal tip of the flap where there has been some mild necrosis, no signs of bacterial infection.  Sutures removed, benzoin, Steri-Strips and Dermabond applied to reinforce the wound.  Assessment/plan:   Traumatic wound dehiscence Sutures removed, benzoin, Steri-Strips and Dermabond applied to reinforce the wound, I think she is doing very well, return to see me in 2 weeks.  ___________________________________________ Ihor Austinhomas J. Benjamin Stainhekkekandam, M.D., ABFM., CAQSM. Primary Care and Sports Medicine [place] [facility]  Adjunct Instructor of Family Medicine  [employer]

## 2017-09-07 DIAGNOSIS — K861 Other chronic pancreatitis: Secondary | ICD-10-CM | POA: Diagnosis not present

## 2017-09-07 DIAGNOSIS — R1013 Epigastric pain: Secondary | ICD-10-CM | POA: Diagnosis not present

## 2017-09-07 DIAGNOSIS — K439 Ventral hernia without obstruction or gangrene: Secondary | ICD-10-CM | POA: Diagnosis not present

## 2017-09-10 DIAGNOSIS — K432 Incisional hernia without obstruction or gangrene: Secondary | ICD-10-CM | POA: Diagnosis not present

## 2017-09-10 DIAGNOSIS — R636 Underweight: Secondary | ICD-10-CM | POA: Diagnosis not present

## 2017-09-10 DIAGNOSIS — K7030 Alcoholic cirrhosis of liver without ascites: Secondary | ICD-10-CM | POA: Diagnosis not present

## 2017-09-10 DIAGNOSIS — K860 Alcohol-induced chronic pancreatitis: Secondary | ICD-10-CM | POA: Diagnosis not present

## 2017-09-10 DIAGNOSIS — E109 Type 1 diabetes mellitus without complications: Secondary | ICD-10-CM | POA: Diagnosis not present

## 2017-09-17 ENCOUNTER — Ambulatory Visit (INDEPENDENT_AMBULATORY_CARE_PROVIDER_SITE_OTHER): Payer: Medicaid Other | Admitting: Sports Medicine

## 2017-09-17 ENCOUNTER — Encounter: Payer: Self-pay | Admitting: Sports Medicine

## 2017-09-17 DIAGNOSIS — T8133XD Disruption of traumatic injury wound repair, subsequent encounter: Secondary | ICD-10-CM

## 2017-09-17 NOTE — Progress Notes (Signed)
  Subjective: Approximately 3 weeks post complex wound revision of a right forearm traumatic dehiscence.  Doing extremely well.  Objective: General: Well-developed, well-nourished, and in no acute distress. Wound: Trident shaped, healing extremely well, there is a bit of scabby eschar at the center of the Trident shaped wound.  No signs of bacterial superinfection.  Sensation is intact.  Assessment/plan:   Traumatic wound dehiscence Return in 1 month. Continued improvements.  ___________________________________________ Ihor Austinhomas J. Benjamin Stainhekkekandam, M.D., ABFM., CAQSM. Primary Care and Sports Medicine [place] [facility]  Adjunct Instructor of Family Medicine  [employer]

## 2017-09-17 NOTE — Assessment & Plan Note (Signed)
Return in 1 month. Continued improvements.

## 2017-10-15 ENCOUNTER — Ambulatory Visit: Payer: Medicaid Other | Admitting: Sports Medicine

## 2017-12-01 DIAGNOSIS — K860 Alcohol-induced chronic pancreatitis: Secondary | ICD-10-CM | POA: Diagnosis not present

## 2017-12-01 DIAGNOSIS — E1065 Type 1 diabetes mellitus with hyperglycemia: Secondary | ICD-10-CM | POA: Diagnosis not present

## 2017-12-01 LAB — HEMOGLOBIN A1C: Hemoglobin A1C: 7.2

## 2017-12-04 ENCOUNTER — Other Ambulatory Visit: Payer: Self-pay | Admitting: Physician Assistant

## 2017-12-04 DIAGNOSIS — F411 Generalized anxiety disorder: Secondary | ICD-10-CM

## 2017-12-04 DIAGNOSIS — F331 Major depressive disorder, recurrent, moderate: Secondary | ICD-10-CM

## 2017-12-04 DIAGNOSIS — G479 Sleep disorder, unspecified: Secondary | ICD-10-CM

## 2017-12-22 ENCOUNTER — Other Ambulatory Visit: Payer: Self-pay | Admitting: Physician Assistant

## 2017-12-26 DIAGNOSIS — M50322 Other cervical disc degeneration at C5-C6 level: Secondary | ICD-10-CM | POA: Diagnosis not present

## 2017-12-26 DIAGNOSIS — M50323 Other cervical disc degeneration at C6-C7 level: Secondary | ICD-10-CM | POA: Diagnosis not present

## 2017-12-26 DIAGNOSIS — Z049 Encounter for examination and observation for unspecified reason: Secondary | ICD-10-CM | POA: Diagnosis not present

## 2017-12-26 DIAGNOSIS — S0083XA Contusion of other part of head, initial encounter: Secondary | ICD-10-CM | POA: Diagnosis not present

## 2017-12-26 DIAGNOSIS — R401 Stupor: Secondary | ICD-10-CM | POA: Diagnosis not present

## 2017-12-26 DIAGNOSIS — G08 Intracranial and intraspinal phlebitis and thrombophlebitis: Secondary | ICD-10-CM | POA: Diagnosis not present

## 2017-12-26 DIAGNOSIS — R55 Syncope and collapse: Secondary | ICD-10-CM | POA: Diagnosis not present

## 2017-12-26 DIAGNOSIS — R9431 Abnormal electrocardiogram [ECG] [EKG]: Secondary | ICD-10-CM | POA: Diagnosis not present

## 2017-12-26 DIAGNOSIS — S59912A Unspecified injury of left forearm, initial encounter: Secondary | ICD-10-CM | POA: Diagnosis not present

## 2017-12-26 DIAGNOSIS — S199XXA Unspecified injury of neck, initial encounter: Secondary | ICD-10-CM | POA: Diagnosis not present

## 2017-12-26 DIAGNOSIS — I609 Nontraumatic subarachnoid hemorrhage, unspecified: Secondary | ICD-10-CM | POA: Diagnosis not present

## 2017-12-26 DIAGNOSIS — S0291XA Unspecified fracture of skull, initial encounter for closed fracture: Secondary | ICD-10-CM | POA: Diagnosis not present

## 2017-12-26 DIAGNOSIS — G9389 Other specified disorders of brain: Secondary | ICD-10-CM | POA: Diagnosis not present

## 2017-12-26 MED ORDER — SODIUM CHLORIDE 0.9 % IV SOLN
10.00 | INTRAVENOUS | Status: DC
Start: ? — End: 2017-12-26

## 2017-12-26 MED ORDER — GENERIC EXTERNAL MEDICATION
10.00 | Status: DC
Start: ? — End: 2017-12-26

## 2017-12-27 DIAGNOSIS — S0291XA Unspecified fracture of skull, initial encounter for closed fracture: Secondary | ICD-10-CM | POA: Diagnosis not present

## 2017-12-27 DIAGNOSIS — S0211HA Other fracture of occiput, left side, initial encounter for closed fracture: Secondary | ICD-10-CM | POA: Diagnosis not present

## 2017-12-27 DIAGNOSIS — R55 Syncope and collapse: Secondary | ICD-10-CM | POA: Diagnosis not present

## 2017-12-27 DIAGNOSIS — I361 Nonrheumatic tricuspid (valve) insufficiency: Secondary | ICD-10-CM | POA: Diagnosis not present

## 2017-12-27 DIAGNOSIS — I609 Nontraumatic subarachnoid hemorrhage, unspecified: Secondary | ICD-10-CM | POA: Diagnosis not present

## 2017-12-27 DIAGNOSIS — K860 Alcohol-induced chronic pancreatitis: Secondary | ICD-10-CM | POA: Diagnosis not present

## 2017-12-27 DIAGNOSIS — Z880 Allergy status to penicillin: Secondary | ICD-10-CM | POA: Diagnosis not present

## 2017-12-27 DIAGNOSIS — K7030 Alcoholic cirrhosis of liver without ascites: Secondary | ICD-10-CM | POA: Diagnosis not present

## 2017-12-27 DIAGNOSIS — E1069 Type 1 diabetes mellitus with other specified complication: Secondary | ICD-10-CM | POA: Diagnosis not present

## 2017-12-28 DIAGNOSIS — G08 Intracranial and intraspinal phlebitis and thrombophlebitis: Secondary | ICD-10-CM | POA: Diagnosis not present

## 2017-12-28 DIAGNOSIS — G9389 Other specified disorders of brain: Secondary | ICD-10-CM | POA: Diagnosis not present

## 2017-12-28 DIAGNOSIS — K860 Alcohol-induced chronic pancreatitis: Secondary | ICD-10-CM | POA: Diagnosis not present

## 2017-12-28 DIAGNOSIS — R401 Stupor: Secondary | ICD-10-CM | POA: Diagnosis not present

## 2017-12-28 DIAGNOSIS — S0291XA Unspecified fracture of skull, initial encounter for closed fracture: Secondary | ICD-10-CM | POA: Diagnosis not present

## 2017-12-28 DIAGNOSIS — E1065 Type 1 diabetes mellitus with hyperglycemia: Secondary | ICD-10-CM | POA: Diagnosis not present

## 2017-12-28 DIAGNOSIS — S0211HA Other fracture of occiput, left side, initial encounter for closed fracture: Secondary | ICD-10-CM | POA: Diagnosis not present

## 2017-12-28 DIAGNOSIS — R55 Syncope and collapse: Secondary | ICD-10-CM | POA: Diagnosis not present

## 2017-12-29 DIAGNOSIS — K860 Alcohol-induced chronic pancreatitis: Secondary | ICD-10-CM | POA: Diagnosis not present

## 2017-12-29 DIAGNOSIS — G08 Intracranial and intraspinal phlebitis and thrombophlebitis: Secondary | ICD-10-CM | POA: Diagnosis not present

## 2017-12-29 DIAGNOSIS — S066X1D Traumatic subarachnoid hemorrhage with loss of consciousness of 30 minutes or less, subsequent encounter: Secondary | ICD-10-CM | POA: Diagnosis not present

## 2017-12-29 DIAGNOSIS — E1065 Type 1 diabetes mellitus with hyperglycemia: Secondary | ICD-10-CM | POA: Diagnosis not present

## 2017-12-29 DIAGNOSIS — R55 Syncope and collapse: Secondary | ICD-10-CM | POA: Diagnosis not present

## 2017-12-29 DIAGNOSIS — S0211HA Other fracture of occiput, left side, initial encounter for closed fracture: Secondary | ICD-10-CM | POA: Diagnosis not present

## 2017-12-29 DIAGNOSIS — S0291XG Unspecified fracture of skull, subsequent encounter for fracture with delayed healing: Secondary | ICD-10-CM | POA: Diagnosis not present

## 2017-12-29 DIAGNOSIS — K7030 Alcoholic cirrhosis of liver without ascites: Secondary | ICD-10-CM | POA: Diagnosis not present

## 2017-12-30 DIAGNOSIS — S066X1D Traumatic subarachnoid hemorrhage with loss of consciousness of 30 minutes or less, subsequent encounter: Secondary | ICD-10-CM | POA: Diagnosis not present

## 2017-12-30 DIAGNOSIS — R55 Syncope and collapse: Secondary | ICD-10-CM | POA: Diagnosis not present

## 2017-12-30 DIAGNOSIS — K7030 Alcoholic cirrhosis of liver without ascites: Secondary | ICD-10-CM | POA: Diagnosis not present

## 2017-12-30 DIAGNOSIS — S0211HA Other fracture of occiput, left side, initial encounter for closed fracture: Secondary | ICD-10-CM | POA: Diagnosis not present

## 2017-12-30 DIAGNOSIS — K860 Alcohol-induced chronic pancreatitis: Secondary | ICD-10-CM | POA: Diagnosis not present

## 2017-12-30 DIAGNOSIS — G08 Intracranial and intraspinal phlebitis and thrombophlebitis: Secondary | ICD-10-CM | POA: Diagnosis not present

## 2017-12-30 DIAGNOSIS — I9589 Other hypotension: Secondary | ICD-10-CM | POA: Diagnosis not present

## 2017-12-30 DIAGNOSIS — E1065 Type 1 diabetes mellitus with hyperglycemia: Secondary | ICD-10-CM | POA: Diagnosis not present

## 2017-12-30 DIAGNOSIS — M961 Postlaminectomy syndrome, not elsewhere classified: Secondary | ICD-10-CM | POA: Diagnosis not present

## 2017-12-31 DIAGNOSIS — K7030 Alcoholic cirrhosis of liver without ascites: Secondary | ICD-10-CM | POA: Diagnosis not present

## 2017-12-31 DIAGNOSIS — E1065 Type 1 diabetes mellitus with hyperglycemia: Secondary | ICD-10-CM | POA: Diagnosis not present

## 2017-12-31 DIAGNOSIS — S066X1D Traumatic subarachnoid hemorrhage with loss of consciousness of 30 minutes or less, subsequent encounter: Secondary | ICD-10-CM | POA: Diagnosis not present

## 2017-12-31 DIAGNOSIS — M961 Postlaminectomy syndrome, not elsewhere classified: Secondary | ICD-10-CM | POA: Diagnosis not present

## 2017-12-31 DIAGNOSIS — K860 Alcohol-induced chronic pancreatitis: Secondary | ICD-10-CM | POA: Diagnosis not present

## 2017-12-31 DIAGNOSIS — G08 Intracranial and intraspinal phlebitis and thrombophlebitis: Secondary | ICD-10-CM | POA: Diagnosis not present

## 2017-12-31 DIAGNOSIS — I9589 Other hypotension: Secondary | ICD-10-CM | POA: Diagnosis not present

## 2017-12-31 DIAGNOSIS — S0211HA Other fracture of occiput, left side, initial encounter for closed fracture: Secondary | ICD-10-CM | POA: Diagnosis not present

## 2017-12-31 DIAGNOSIS — R55 Syncope and collapse: Secondary | ICD-10-CM | POA: Diagnosis not present

## 2018-01-01 DIAGNOSIS — S0219XA Other fracture of base of skull, initial encounter for closed fracture: Secondary | ICD-10-CM | POA: Diagnosis not present

## 2018-01-01 DIAGNOSIS — S0211HA Other fracture of occiput, left side, initial encounter for closed fracture: Secondary | ICD-10-CM | POA: Diagnosis not present

## 2018-01-01 DIAGNOSIS — K860 Alcohol-induced chronic pancreatitis: Secondary | ICD-10-CM | POA: Diagnosis not present

## 2018-01-01 DIAGNOSIS — I950 Idiopathic hypotension: Secondary | ICD-10-CM | POA: Diagnosis not present

## 2018-01-01 DIAGNOSIS — E1065 Type 1 diabetes mellitus with hyperglycemia: Secondary | ICD-10-CM | POA: Diagnosis not present

## 2018-01-01 DIAGNOSIS — R55 Syncope and collapse: Secondary | ICD-10-CM | POA: Diagnosis not present

## 2018-01-02 DIAGNOSIS — K860 Alcohol-induced chronic pancreatitis: Secondary | ICD-10-CM | POA: Diagnosis not present

## 2018-01-02 DIAGNOSIS — I950 Idiopathic hypotension: Secondary | ICD-10-CM | POA: Diagnosis not present

## 2018-01-02 DIAGNOSIS — R55 Syncope and collapse: Secondary | ICD-10-CM | POA: Diagnosis not present

## 2018-01-02 DIAGNOSIS — E1065 Type 1 diabetes mellitus with hyperglycemia: Secondary | ICD-10-CM | POA: Diagnosis not present

## 2018-01-02 DIAGNOSIS — S0211HA Other fracture of occiput, left side, initial encounter for closed fracture: Secondary | ICD-10-CM | POA: Diagnosis not present

## 2018-01-03 DIAGNOSIS — I9589 Other hypotension: Secondary | ICD-10-CM | POA: Diagnosis not present

## 2018-01-03 DIAGNOSIS — G08 Intracranial and intraspinal phlebitis and thrombophlebitis: Secondary | ICD-10-CM | POA: Diagnosis not present

## 2018-01-03 DIAGNOSIS — E1065 Type 1 diabetes mellitus with hyperglycemia: Secondary | ICD-10-CM | POA: Diagnosis not present

## 2018-01-03 DIAGNOSIS — K860 Alcohol-induced chronic pancreatitis: Secondary | ICD-10-CM | POA: Diagnosis not present

## 2018-01-03 DIAGNOSIS — R55 Syncope and collapse: Secondary | ICD-10-CM | POA: Diagnosis not present

## 2018-01-03 DIAGNOSIS — K7030 Alcoholic cirrhosis of liver without ascites: Secondary | ICD-10-CM | POA: Diagnosis not present

## 2018-01-03 DIAGNOSIS — S066X1D Traumatic subarachnoid hemorrhage with loss of consciousness of 30 minutes or less, subsequent encounter: Secondary | ICD-10-CM | POA: Diagnosis not present

## 2018-01-03 DIAGNOSIS — M961 Postlaminectomy syndrome, not elsewhere classified: Secondary | ICD-10-CM | POA: Diagnosis not present

## 2018-01-10 DIAGNOSIS — I9589 Other hypotension: Secondary | ICD-10-CM | POA: Diagnosis not present

## 2018-01-10 DIAGNOSIS — S0211HA Other fracture of occiput, left side, initial encounter for closed fracture: Secondary | ICD-10-CM | POA: Diagnosis not present

## 2018-01-10 DIAGNOSIS — S0291XG Unspecified fracture of skull, subsequent encounter for fracture with delayed healing: Secondary | ICD-10-CM | POA: Diagnosis not present

## 2018-01-10 DIAGNOSIS — G08 Intracranial and intraspinal phlebitis and thrombophlebitis: Secondary | ICD-10-CM | POA: Diagnosis not present

## 2018-01-10 DIAGNOSIS — E1065 Type 1 diabetes mellitus with hyperglycemia: Secondary | ICD-10-CM | POA: Diagnosis not present

## 2018-01-10 DIAGNOSIS — R55 Syncope and collapse: Secondary | ICD-10-CM | POA: Diagnosis not present

## 2018-01-10 DIAGNOSIS — S066X1D Traumatic subarachnoid hemorrhage with loss of consciousness of 30 minutes or less, subsequent encounter: Secondary | ICD-10-CM | POA: Diagnosis not present

## 2018-01-10 DIAGNOSIS — M961 Postlaminectomy syndrome, not elsewhere classified: Secondary | ICD-10-CM | POA: Diagnosis not present

## 2018-01-10 DIAGNOSIS — K860 Alcohol-induced chronic pancreatitis: Secondary | ICD-10-CM | POA: Diagnosis not present

## 2018-01-10 DIAGNOSIS — I951 Orthostatic hypotension: Secondary | ICD-10-CM | POA: Diagnosis not present

## 2018-01-12 ENCOUNTER — Ambulatory Visit (INDEPENDENT_AMBULATORY_CARE_PROVIDER_SITE_OTHER): Payer: Medicaid Other | Admitting: Physician Assistant

## 2018-01-12 ENCOUNTER — Encounter: Payer: Self-pay | Admitting: Physician Assistant

## 2018-01-12 VITALS — BP 145/75 | HR 63 | Temp 97.9°F | Ht 68.0 in | Wt 119.8 lb

## 2018-01-12 DIAGNOSIS — I951 Orthostatic hypotension: Secondary | ICD-10-CM

## 2018-01-12 DIAGNOSIS — M25471 Effusion, right ankle: Secondary | ICD-10-CM | POA: Diagnosis not present

## 2018-01-12 DIAGNOSIS — E785 Hyperlipidemia, unspecified: Secondary | ICD-10-CM | POA: Diagnosis not present

## 2018-01-12 DIAGNOSIS — G44329 Chronic post-traumatic headache, not intractable: Secondary | ICD-10-CM

## 2018-01-12 DIAGNOSIS — G08 Intracranial and intraspinal phlebitis and thrombophlebitis: Secondary | ICD-10-CM | POA: Diagnosis not present

## 2018-01-12 DIAGNOSIS — F331 Major depressive disorder, recurrent, moderate: Secondary | ICD-10-CM

## 2018-01-12 DIAGNOSIS — G479 Sleep disorder, unspecified: Secondary | ICD-10-CM

## 2018-01-12 DIAGNOSIS — F411 Generalized anxiety disorder: Secondary | ICD-10-CM

## 2018-01-12 DIAGNOSIS — R55 Syncope and collapse: Secondary | ICD-10-CM

## 2018-01-12 DIAGNOSIS — Z9189 Other specified personal risk factors, not elsewhere classified: Secondary | ICD-10-CM | POA: Diagnosis not present

## 2018-01-12 DIAGNOSIS — R635 Abnormal weight gain: Secondary | ICD-10-CM | POA: Diagnosis not present

## 2018-01-12 DIAGNOSIS — S0291XA Unspecified fracture of skull, initial encounter for closed fracture: Secondary | ICD-10-CM

## 2018-01-12 DIAGNOSIS — M25472 Effusion, left ankle: Secondary | ICD-10-CM

## 2018-01-12 DIAGNOSIS — R413 Other amnesia: Secondary | ICD-10-CM | POA: Diagnosis not present

## 2018-01-12 MED ORDER — AMBULATORY NON FORMULARY MEDICATION: 0 refills | Status: DC

## 2018-01-12 MED ORDER — AMITRIPTYLINE HCL 25 MG PO TABS: 25.0000 mg | ORAL_TABLET | Freq: Every day | ORAL | 0 refills | Status: DC

## 2018-01-12 MED ORDER — PROAIR HFA 108 (90 BASE) MCG/ACT IN AERS: INHALATION_SPRAY | RESPIRATORY_TRACT | 5 refills | Status: DC

## 2018-01-12 MED ORDER — SERTRALINE HCL 100 MG PO TABS: 100.0000 mg | ORAL_TABLET | Freq: Every day | ORAL | 3 refills | Status: DC

## 2018-01-12 MED ORDER — PRAVASTATIN SODIUM 20 MG PO TABS: 20.0000 mg | ORAL_TABLET | Freq: Every day | ORAL | 3 refills | Status: DC

## 2018-01-12 MED ORDER — HYDROCODONE-ACETAMINOPHEN 7.5-325 MG PO TABS: 1.0000 | ORAL_TABLET | Freq: Three times a day (TID) | ORAL | 0 refills | Status: DC | PRN

## 2018-01-12 MED ORDER — TRAZODONE HCL 50 MG PO TABS: 50.0000 mg | ORAL_TABLET | Freq: Every day | ORAL | 1 refills | Status: DC

## 2018-01-12 NOTE — Progress Notes (Addendum)
Subjective:    Patient ID: Valerie Pratt, female    DOB: Jun 19, 1965, 53 y.o.   MRN: [id number]  HPI  Pt is a 53 yo female with T1DM, chronic pancreatitis(due to past alcohol abuse), COPD who presents to the clinic to follow up after ED visit and admission after syncope that resulted in skull fracture, pneumocephalus, and cerebral venous sinus thrombosis.   Hospital Course  Valerie Pratt is a 53 y.o. female with a past medical history of ETOH dependency with chronic pancreatitis( patient reports not drinking alcohol since more than 5 years ago) who presented to the emergency department at [facility] with sudden loss of consciousness while standing twice the previous day. No seizure activity noted. She suffered forehead and occipital bruising and lacerations as as result of falling. First event not witnessed. No convulsion seen during second event. Upon presentation to ED she complained of moderate to severe headache however,she denied focal deficits, previous syncope, seizures. She was seen at [facility] and had CT showing skull fracture and pneumocephalus. Neurology team was consulted and the patient was transferred to [facility] under neurology service for further evaluation and management. During the hospital stay, Neurosurgery (for skull fracture) Infectious Disease ( advisability of prophylactic antibiotics given skull fracture), PharmD ( for coumadin dosing), and [facility](for syncope) were consulted for co-management.   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 MRI brain showed scattered Traumatic Subarachnoid Hemorrhages but was concerning for a asymptomatic Dural venous sinus thrombosis which was later confirmed on MRV (involving the lateral left transverse sinus and left sigmoid sinus, either  occlusive or near-occlusive). Electrocardiogram was checked showing normal sinus rhythm. Echocardiogram with Doppler complete with bubble study was obtained on 12/27/17, showed no cardiac etiology for the stroke. Left ventricular ejection fraction was 60-65 %. No regional wall motion abnormalities present. Troponin was checked and was within normal limits. Last Lipid profile was checked 01/20/17 and the LDL was noted to be 80. She was continued on same dosage pravastatin 20 mg orally at bedtime. The patient was not given high dose statin due to the following reason:. Presently is achieving target LDL or LDL<100 spontaneously or by medical therapy. She had a urinalysis which did not reveal any evidence of UTI. She was started on DVT prophylaxis with a heparin drip and coumadin. She was discharged on Lovenox SQ and Coumadin. She will be followed by the coumadin/anticoagulation clinic. Electrolytes were checked and replaced as needed. She was started on a consistent carb diet after nursing bedside swallow evaluation. Nutritional studies were done and replaced as appropriate. Valerie Pratt was admitted with syncope and her blood pressure was found to be chronically low and her orthostatics were positive for hypotension. She was started on florinef and will continue for discharge. Syncope/Hypotension work up will be completed by PCP, Cardiology. Referrals placed.  We believe she is medically safe to be discharged at this point in time. She will need close follow up with primary care physician and outpatient neurologist after discharge for optimal and continued vascular risk factor assessment and reduction. She is agreeable to being discharged at this point in time. We discussed the diagnosis, prognosis and treatment performed during her hospital stay. We also discussed the outlined management plan and the importance of compliance with treatment regime. The risks and benefits of medications were also discussed at length.  She verbalized complete understanding, is agreeable to plan of care and would like to be discharged today.   No known cause of the extremely low BP. She now complains of bilateral ankle and leg swelling. She reports gaining about a pound a day since being gone from hospital. She continues to have a headache most days. Located on the left side of her head mostly but can wrap around. Rates 6/10 at its worse. norco used if gets really bad. No vision changes or nausea.  Tylenol helps some. Her coumadin is managed by [facility]. Goal 3 to 3.5.   Never heard about a cardiology referral.   .. Active Ambulatory Problems    Diagnosis Date Noted  . History of pancreatitis 05/29/2015  . Constipation 05/29/2015  . Cough 05/29/2015  . Unintentional weight loss 05/29/2015  . Elevated random blood glucose level 05/29/2015  . Type I diabetes mellitus (HCC) 06/04/2015  . 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 Current smoker 08/26/2015  . Chronic obstructive pulmonary disease (HCC) 08/26/2015  . Depression 08/26/2015  . Former smoker 09/30/2015  . Sleeping difficulties 09/30/2015  . GAD (generalized anxiety disorder) 12/16/2015  . Breast cancer screening 01/13/2016  . Screening for lipid disorders 01/13/2016  . Colon cancer screening 01/13/2016  . Nuclear sclerotic cataract of both eyes 01/15/2016  . Chronic pancreatitis (HCC) 06/10/2017  . Dyslipidemia, goal LDL below 70 06/10/2017  . Traumatic wound dehiscence 08/27/2017  . Forearm laceration with complication, left, sequela 08/27/2017  . Cerebral venous sinus thrombosis 01/14/2018  . Ankle edema, bilateral 01/14/2018  . Closed skull fracture (HCC) 01/14/2018  . Syncope and collapse 01/14/2018  . Chronic post-traumatic headache, not intractable 01/14/2018  . Weight gain 01/14/2018  . Orthostatic hypotension 01/14/2018  . Memory changes 01/14/2018   Resolved Ambulatory  Problems    Diagnosis Date Noted  . Excessive thirst 05/29/2015   Past Medical History:  Diagnosis Date  . Anxiety   . COPD (chronic obstructive pulmonary disease) (HCC)   . Diabetes mellitus without complication (HCC)   . Insomnia          Review of Systems See HPI>     Objective:   Physical Exam Vitals signs reviewed.  Constitutional:      Appearance: Normal appearance.  HENT:     Right Ear: Tympanic membrane normal.     Ears:     Comments: Dried blood in the left TM.  Cardiovascular:     Rate and Rhythm: Normal rate and regular rhythm.  Pulmonary:     Effort: Pulmonary effort is normal.     Breath sounds: Normal breath sounds.  Skin:    General: Skin is warm.     Comments: Bilateral pedal/ankle edema 1+.   Neurological:     General: No focal deficit present.     Mental Status: She is alert and oriented to person, place, and time.  Psychiatric:        Mood and Affect: Mood normal.        Behavior: Behavior normal.           Assessment & Plan:  Marland KitchenMarland KitchenMerika was seen today for hospitalization follow-up.  Diagnoses and all orders for this visit:  Syncope and collapse -     Ambulatory referral to Cardiology  GAD (generalized anxiety disorder) -     sertraline (ZOLOFT) 100 MG tablet; Take 1 tablet (100 mg total) by mouth daily.  Moderate episode of recurrent major  depressive disorder (HCC) -     sertraline (ZOLOFT) 100 MG tablet; Take 1 tablet (100 mg total) by mouth daily.  Sleeping difficulties -     traZODone (DESYREL) 50 MG tablet; Take 1-2 tablets (50-100 mg total) by mouth at bedtime.  Cerebral venous sinus thrombosis  At high risk for osteoporosis -     DG Bone Density  Closed fracture of skull, unspecified bone, initial encounter (HCC) -     DG Bone Density  Ankle edema, bilateral  Weight gain  Chronic post-traumatic headache, not intractable -     amitriptyline (ELAVIL) 25 MG tablet; Take 1 tablet (25 mg total) by mouth at bedtime. -      HYDROcodone-acetaminophen (NORCO) 7.5-325 MG tablet; Take 1 tablet by mouth every 8 (eight) hours as needed for moderate pain (cough).  Orthostatic hypotension -     AMBULATORY NON FORMULARY MEDICATION; Blood pressure cuff to manage orthostatic hypotension -     Ambulatory referral to Cardiology  Hyperlipidemia LDL goal <70 -     pravastatin (PRAVACHOL) 20 MG tablet; Take 1 tablet (20 mg total) by mouth daily.  Memory changes  Other orders -     PROAIR HFA 108 (90 Base) MCG/ACT inhaler; INHALE 2 PUFFS BY MOUTH EVERY 6 HOURS AS NEEDED FOR WHEEZING OR SHORTNESS OF BREATH -     Discontinue: AMBULATORY NON FORMULARY MEDICATION; Blood pressure cuff to manage orthostatic hypotension   Certainly a very complicated presentation. Since no known cause of syncope and collapse was found with neurological(other than cerebral thrombosis) which was likely post trauma and cardiology it is suspected orthostatic hypotension could have caused the syncope. She did believe to have a stomach virus and not eating and drinking as she normally would.   She was started on florinef which has increased her BP nicely but now I believe causing fluid retention, hence, the bilateral ankle edema and 10lb weigh gain in less than 2 weeks. Decrease down to 1 tablet a day. Get BP cuff and start monitoring weight, BP, swelling daily. Please keep legs elevated and consider compression stockings.   Pt has neurology appt Jan 20th. They should work up headache more. I did refill norco for severe pain. I did start elavil to help sleep better since complaining of not being able to sleep and may provide some headache relief. Likely headache is post concussion/trauma which could take some time to resolve.   She never heard about cardiology with her being high risk. Will make referral today.   Pt is high osteoporosis risk with recent fracture will get bone density.   Marland Kitchen..Spent 50 minutes with patient and greater than 50 percent of  visit spent counseling patient regarding treatment plan.

## 2018-01-12 NOTE — Patient Instructions (Addendum)
D3 OTC 2000 units a day.  Will get bone density.  Fludrocortisone .1mg  decrease to once a day.  Start amitripyline at bedtime.  Elevate and compress legs.  NO DOG WALKING!  Will make referral to cardiology.

## 2018-01-14 ENCOUNTER — Encounter: Payer: Self-pay | Admitting: Physician Assistant

## 2018-01-14 DIAGNOSIS — I951 Orthostatic hypotension: Secondary | ICD-10-CM | POA: Insufficient documentation

## 2018-01-14 DIAGNOSIS — S0291XA Unspecified fracture of skull, initial encounter for closed fracture: Secondary | ICD-10-CM | POA: Insufficient documentation

## 2018-01-14 DIAGNOSIS — M25471 Effusion, right ankle: Secondary | ICD-10-CM | POA: Insufficient documentation

## 2018-01-14 DIAGNOSIS — R55 Syncope and collapse: Secondary | ICD-10-CM | POA: Insufficient documentation

## 2018-01-14 DIAGNOSIS — G44329 Chronic post-traumatic headache, not intractable: Secondary | ICD-10-CM | POA: Insufficient documentation

## 2018-01-14 DIAGNOSIS — R413 Other amnesia: Secondary | ICD-10-CM | POA: Insufficient documentation

## 2018-01-14 DIAGNOSIS — R635 Abnormal weight gain: Secondary | ICD-10-CM | POA: Insufficient documentation

## 2018-01-14 DIAGNOSIS — M25472 Effusion, left ankle: Secondary | ICD-10-CM

## 2018-01-14 DIAGNOSIS — G08 Intracranial and intraspinal phlebitis and thrombophlebitis: Secondary | ICD-10-CM | POA: Insufficient documentation

## 2018-01-17 DIAGNOSIS — S066X1D Traumatic subarachnoid hemorrhage with loss of consciousness of 30 minutes or less, subsequent encounter: Secondary | ICD-10-CM | POA: Diagnosis not present

## 2018-01-17 DIAGNOSIS — G08 Intracranial and intraspinal phlebitis and thrombophlebitis: Secondary | ICD-10-CM | POA: Diagnosis not present

## 2018-01-19 ENCOUNTER — Ambulatory Visit: Payer: Medicaid Other | Admitting: Physician Assistant

## 2018-01-19 ENCOUNTER — Encounter: Payer: Self-pay | Admitting: Physician Assistant

## 2018-01-24 DIAGNOSIS — S066X1D Traumatic subarachnoid hemorrhage with loss of consciousness of 30 minutes or less, subsequent encounter: Secondary | ICD-10-CM | POA: Diagnosis not present

## 2018-01-24 DIAGNOSIS — G08 Intracranial and intraspinal phlebitis and thrombophlebitis: Secondary | ICD-10-CM | POA: Diagnosis not present

## 2018-01-31 DIAGNOSIS — G08 Intracranial and intraspinal phlebitis and thrombophlebitis: Secondary | ICD-10-CM | POA: Diagnosis not present

## 2018-01-31 DIAGNOSIS — R51 Headache: Secondary | ICD-10-CM | POA: Diagnosis not present

## 2018-01-31 DIAGNOSIS — R0683 Snoring: Secondary | ICD-10-CM | POA: Diagnosis not present

## 2018-01-31 DIAGNOSIS — S066X1D Traumatic subarachnoid hemorrhage with loss of consciousness of 30 minutes or less, subsequent encounter: Secondary | ICD-10-CM | POA: Diagnosis not present

## 2018-02-02 ENCOUNTER — Ambulatory Visit (INDEPENDENT_AMBULATORY_CARE_PROVIDER_SITE_OTHER): Payer: Medicaid Other

## 2018-02-02 DIAGNOSIS — M85852 Other specified disorders of bone density and structure, left thigh: Secondary | ICD-10-CM | POA: Diagnosis not present

## 2018-02-02 DIAGNOSIS — M8589 Other specified disorders of bone density and structure, multiple sites: Secondary | ICD-10-CM | POA: Diagnosis not present

## 2018-02-02 NOTE — Progress Notes (Signed)
Call pt: bone density shows low bone mass. Make sure to be taking vitamin D 1000 units daily with 1300mg  of calcium or 4 servings of dairy to prevent osteoporosis.

## 2018-02-07 DIAGNOSIS — G08 Intracranial and intraspinal phlebitis and thrombophlebitis: Secondary | ICD-10-CM | POA: Diagnosis not present

## 2018-02-07 DIAGNOSIS — S066X1D Traumatic subarachnoid hemorrhage with loss of consciousness of 30 minutes or less, subsequent encounter: Secondary | ICD-10-CM | POA: Diagnosis not present

## 2018-02-14 DIAGNOSIS — G08 Intracranial and intraspinal phlebitis and thrombophlebitis: Secondary | ICD-10-CM | POA: Diagnosis not present

## 2018-02-14 DIAGNOSIS — S066X1D Traumatic subarachnoid hemorrhage with loss of consciousness of 30 minutes or less, subsequent encounter: Secondary | ICD-10-CM | POA: Diagnosis not present

## 2018-02-28 DIAGNOSIS — G08 Intracranial and intraspinal phlebitis and thrombophlebitis: Secondary | ICD-10-CM | POA: Diagnosis not present

## 2018-02-28 DIAGNOSIS — S066X1D Traumatic subarachnoid hemorrhage with loss of consciousness of 30 minutes or less, subsequent encounter: Secondary | ICD-10-CM | POA: Diagnosis not present

## 2018-03-04 DIAGNOSIS — G08 Intracranial and intraspinal phlebitis and thrombophlebitis: Secondary | ICD-10-CM | POA: Diagnosis not present

## 2018-03-04 DIAGNOSIS — I951 Orthostatic hypotension: Secondary | ICD-10-CM | POA: Diagnosis not present

## 2018-03-04 DIAGNOSIS — K860 Alcohol-induced chronic pancreatitis: Secondary | ICD-10-CM | POA: Diagnosis not present

## 2018-03-04 DIAGNOSIS — R55 Syncope and collapse: Secondary | ICD-10-CM | POA: Diagnosis not present

## 2018-03-08 DIAGNOSIS — K860 Alcohol-induced chronic pancreatitis: Secondary | ICD-10-CM | POA: Diagnosis not present

## 2018-03-08 DIAGNOSIS — I951 Orthostatic hypotension: Secondary | ICD-10-CM | POA: Diagnosis not present

## 2018-03-08 DIAGNOSIS — G08 Intracranial and intraspinal phlebitis and thrombophlebitis: Secondary | ICD-10-CM | POA: Diagnosis not present

## 2018-03-08 DIAGNOSIS — R55 Syncope and collapse: Secondary | ICD-10-CM | POA: Diagnosis not present

## 2018-03-09 DIAGNOSIS — I951 Orthostatic hypotension: Secondary | ICD-10-CM | POA: Diagnosis not present

## 2018-03-09 DIAGNOSIS — R55 Syncope and collapse: Secondary | ICD-10-CM | POA: Diagnosis not present

## 2018-03-09 DIAGNOSIS — G08 Intracranial and intraspinal phlebitis and thrombophlebitis: Secondary | ICD-10-CM | POA: Diagnosis not present

## 2018-03-09 DIAGNOSIS — K860 Alcohol-induced chronic pancreatitis: Secondary | ICD-10-CM | POA: Diagnosis not present

## 2018-03-14 DIAGNOSIS — S066X1D Traumatic subarachnoid hemorrhage with loss of consciousness of 30 minutes or less, subsequent encounter: Secondary | ICD-10-CM | POA: Diagnosis not present

## 2018-03-14 DIAGNOSIS — G08 Intracranial and intraspinal phlebitis and thrombophlebitis: Secondary | ICD-10-CM | POA: Diagnosis not present

## 2018-03-15 DIAGNOSIS — I493 Ventricular premature depolarization: Secondary | ICD-10-CM | POA: Diagnosis not present

## 2018-03-15 DIAGNOSIS — I491 Atrial premature depolarization: Secondary | ICD-10-CM | POA: Diagnosis not present

## 2018-03-21 DIAGNOSIS — G08 Intracranial and intraspinal phlebitis and thrombophlebitis: Secondary | ICD-10-CM | POA: Diagnosis not present

## 2018-03-21 DIAGNOSIS — S066X1D Traumatic subarachnoid hemorrhage with loss of consciousness of 30 minutes or less, subsequent encounter: Secondary | ICD-10-CM | POA: Diagnosis not present

## 2018-04-04 DIAGNOSIS — S066X1D Traumatic subarachnoid hemorrhage with loss of consciousness of 30 minutes or less, subsequent encounter: Secondary | ICD-10-CM | POA: Diagnosis not present

## 2018-04-04 DIAGNOSIS — G08 Intracranial and intraspinal phlebitis and thrombophlebitis: Secondary | ICD-10-CM | POA: Diagnosis not present

## 2018-04-19 DIAGNOSIS — G08 Intracranial and intraspinal phlebitis and thrombophlebitis: Secondary | ICD-10-CM | POA: Diagnosis not present

## 2018-04-19 DIAGNOSIS — R55 Syncope and collapse: Secondary | ICD-10-CM | POA: Diagnosis not present

## 2018-04-26 DIAGNOSIS — S066X1D Traumatic subarachnoid hemorrhage with loss of consciousness of 30 minutes or less, subsequent encounter: Secondary | ICD-10-CM | POA: Diagnosis not present

## 2018-04-26 DIAGNOSIS — G08 Intracranial and intraspinal phlebitis and thrombophlebitis: Secondary | ICD-10-CM | POA: Diagnosis not present

## 2018-04-27 ENCOUNTER — Encounter: Payer: Self-pay | Admitting: Physician Assistant

## 2018-05-02 ENCOUNTER — Other Ambulatory Visit: Payer: Self-pay | Admitting: Physician Assistant

## 2018-05-02 DIAGNOSIS — Z1239 Encounter for other screening for malignant neoplasm of breast: Secondary | ICD-10-CM

## 2018-05-12 DIAGNOSIS — G44329 Chronic post-traumatic headache, not intractable: Secondary | ICD-10-CM | POA: Diagnosis not present

## 2018-05-12 DIAGNOSIS — R202 Paresthesia of skin: Secondary | ICD-10-CM | POA: Diagnosis not present

## 2018-05-12 DIAGNOSIS — R200 Anesthesia of skin: Secondary | ICD-10-CM | POA: Diagnosis not present

## 2018-05-24 DIAGNOSIS — S066X1D Traumatic subarachnoid hemorrhage with loss of consciousness of 30 minutes or less, subsequent encounter: Secondary | ICD-10-CM | POA: Diagnosis not present

## 2018-05-24 DIAGNOSIS — G08 Intracranial and intraspinal phlebitis and thrombophlebitis: Secondary | ICD-10-CM | POA: Diagnosis not present

## 2018-05-26 ENCOUNTER — Ambulatory Visit: Payer: Medicaid Other

## 2018-05-26 DIAGNOSIS — E109 Type 1 diabetes mellitus without complications: Secondary | ICD-10-CM | POA: Diagnosis not present

## 2018-06-01 DIAGNOSIS — E109 Type 1 diabetes mellitus without complications: Secondary | ICD-10-CM | POA: Diagnosis not present

## 2018-06-01 DIAGNOSIS — K860 Alcohol-induced chronic pancreatitis: Secondary | ICD-10-CM | POA: Diagnosis not present

## 2018-06-01 DIAGNOSIS — K7030 Alcoholic cirrhosis of liver without ascites: Secondary | ICD-10-CM | POA: Diagnosis not present

## 2018-06-07 DIAGNOSIS — E109 Type 1 diabetes mellitus without complications: Secondary | ICD-10-CM | POA: Diagnosis not present

## 2018-06-21 DIAGNOSIS — G08 Intracranial and intraspinal phlebitis and thrombophlebitis: Secondary | ICD-10-CM | POA: Diagnosis not present

## 2018-06-21 DIAGNOSIS — S066X1D Traumatic subarachnoid hemorrhage with loss of consciousness of 30 minutes or less, subsequent encounter: Secondary | ICD-10-CM | POA: Diagnosis not present

## 2018-07-12 DIAGNOSIS — G08 Intracranial and intraspinal phlebitis and thrombophlebitis: Secondary | ICD-10-CM | POA: Diagnosis not present

## 2018-07-12 DIAGNOSIS — Z7901 Long term (current) use of anticoagulants: Secondary | ICD-10-CM | POA: Diagnosis not present

## 2018-07-12 DIAGNOSIS — S066X1D Traumatic subarachnoid hemorrhage with loss of consciousness of 30 minutes or less, subsequent encounter: Secondary | ICD-10-CM | POA: Diagnosis not present

## 2018-07-14 ENCOUNTER — Other Ambulatory Visit: Payer: Self-pay | Admitting: Physician Assistant

## 2018-07-14 DIAGNOSIS — G479 Sleep disorder, unspecified: Secondary | ICD-10-CM

## 2018-07-15 NOTE — Telephone Encounter (Signed)
Last seen 01/12/18. No up coming appointments.

## 2018-07-28 DIAGNOSIS — S066X1D Traumatic subarachnoid hemorrhage with loss of consciousness of 30 minutes or less, subsequent encounter: Secondary | ICD-10-CM | POA: Diagnosis not present

## 2018-07-28 DIAGNOSIS — G08 Intracranial and intraspinal phlebitis and thrombophlebitis: Secondary | ICD-10-CM | POA: Diagnosis not present

## 2018-07-28 DIAGNOSIS — Z7901 Long term (current) use of anticoagulants: Secondary | ICD-10-CM | POA: Diagnosis not present

## 2018-08-01 DIAGNOSIS — R202 Paresthesia of skin: Secondary | ICD-10-CM | POA: Diagnosis not present

## 2018-08-01 DIAGNOSIS — R200 Anesthesia of skin: Secondary | ICD-10-CM | POA: Diagnosis not present

## 2018-08-23 DIAGNOSIS — Z7901 Long term (current) use of anticoagulants: Secondary | ICD-10-CM | POA: Diagnosis not present

## 2018-08-23 DIAGNOSIS — S066X1D Traumatic subarachnoid hemorrhage with loss of consciousness of 30 minutes or less, subsequent encounter: Secondary | ICD-10-CM | POA: Diagnosis not present

## 2018-08-23 DIAGNOSIS — G08 Intracranial and intraspinal phlebitis and thrombophlebitis: Secondary | ICD-10-CM | POA: Diagnosis not present

## 2018-08-27 ENCOUNTER — Other Ambulatory Visit: Payer: Self-pay | Admitting: Physician Assistant

## 2018-09-20 DIAGNOSIS — R200 Anesthesia of skin: Secondary | ICD-10-CM | POA: Diagnosis not present

## 2018-09-20 DIAGNOSIS — M50322 Other cervical disc degeneration at C5-C6 level: Secondary | ICD-10-CM | POA: Diagnosis not present

## 2018-09-20 DIAGNOSIS — R202 Paresthesia of skin: Secondary | ICD-10-CM | POA: Diagnosis not present

## 2018-09-20 DIAGNOSIS — M47812 Spondylosis without myelopathy or radiculopathy, cervical region: Secondary | ICD-10-CM | POA: Diagnosis not present

## 2018-10-10 ENCOUNTER — Other Ambulatory Visit: Payer: Self-pay | Admitting: Physician Assistant

## 2018-10-17 DIAGNOSIS — Z7901 Long term (current) use of anticoagulants: Secondary | ICD-10-CM | POA: Diagnosis not present

## 2018-10-17 DIAGNOSIS — G08 Intracranial and intraspinal phlebitis and thrombophlebitis: Secondary | ICD-10-CM | POA: Diagnosis not present

## 2018-10-17 DIAGNOSIS — S066X1D Traumatic subarachnoid hemorrhage with loss of consciousness of 30 minutes or less, subsequent encounter: Secondary | ICD-10-CM | POA: Diagnosis not present

## 2018-10-24 DIAGNOSIS — E109 Type 1 diabetes mellitus without complications: Secondary | ICD-10-CM | POA: Diagnosis not present

## 2018-10-24 DIAGNOSIS — Z23 Encounter for immunization: Secondary | ICD-10-CM | POA: Diagnosis not present

## 2018-10-25 ENCOUNTER — Other Ambulatory Visit: Payer: Self-pay | Admitting: Sports Medicine

## 2018-10-25 DIAGNOSIS — G479 Sleep disorder, unspecified: Secondary | ICD-10-CM

## 2018-10-25 DIAGNOSIS — K860 Alcohol-induced chronic pancreatitis: Secondary | ICD-10-CM | POA: Diagnosis not present

## 2018-10-25 DIAGNOSIS — E109 Type 1 diabetes mellitus without complications: Secondary | ICD-10-CM | POA: Diagnosis not present

## 2018-10-25 NOTE — Telephone Encounter (Signed)
To PCP

## 2018-11-07 DIAGNOSIS — G08 Intracranial and intraspinal phlebitis and thrombophlebitis: Secondary | ICD-10-CM | POA: Diagnosis not present

## 2018-11-07 DIAGNOSIS — S066X1D Traumatic subarachnoid hemorrhage with loss of consciousness of 30 minutes or less, subsequent encounter: Secondary | ICD-10-CM | POA: Diagnosis not present

## 2018-11-07 DIAGNOSIS — Z7901 Long term (current) use of anticoagulants: Secondary | ICD-10-CM | POA: Diagnosis not present

## 2018-11-11 ENCOUNTER — Other Ambulatory Visit: Payer: Self-pay | Admitting: Physician Assistant

## 2018-12-05 DIAGNOSIS — Z7901 Long term (current) use of anticoagulants: Secondary | ICD-10-CM | POA: Diagnosis not present

## 2018-12-05 DIAGNOSIS — S066X1D Traumatic subarachnoid hemorrhage with loss of consciousness of 30 minutes or less, subsequent encounter: Secondary | ICD-10-CM | POA: Diagnosis not present

## 2018-12-05 DIAGNOSIS — G08 Intracranial and intraspinal phlebitis and thrombophlebitis: Secondary | ICD-10-CM | POA: Diagnosis not present

## 2018-12-14 ENCOUNTER — Encounter: Payer: Self-pay | Admitting: Physician Assistant

## 2018-12-14 ENCOUNTER — Telehealth (INDEPENDENT_AMBULATORY_CARE_PROVIDER_SITE_OTHER): Payer: Medicaid Other | Admitting: Physician Assistant

## 2018-12-14 VITALS — Temp 97.6°F | Ht 68.0 in | Wt 115.0 lb

## 2018-12-14 DIAGNOSIS — G44321 Chronic post-traumatic headache, intractable: Secondary | ICD-10-CM

## 2018-12-14 DIAGNOSIS — F331 Major depressive disorder, recurrent, moderate: Secondary | ICD-10-CM | POA: Diagnosis not present

## 2018-12-14 DIAGNOSIS — E785 Hyperlipidemia, unspecified: Secondary | ICD-10-CM

## 2018-12-14 DIAGNOSIS — F411 Generalized anxiety disorder: Secondary | ICD-10-CM

## 2018-12-14 DIAGNOSIS — G479 Sleep disorder, unspecified: Secondary | ICD-10-CM | POA: Diagnosis not present

## 2018-12-14 MED ORDER — PROAIR HFA 108 (90 BASE) MCG/ACT IN AERS: INHALATION_SPRAY | RESPIRATORY_TRACT | 5 refills | Status: DC

## 2018-12-14 MED ORDER — EMGALITY 120 MG/ML ~~LOC~~ SOAJ: 240.0000 mg | Freq: Once | SUBCUTANEOUS | 0 refills | Status: AC

## 2018-12-14 MED ORDER — PRAVASTATIN SODIUM 20 MG PO TABS: 20.0000 mg | ORAL_TABLET | Freq: Every day | ORAL | 1 refills | Status: DC

## 2018-12-14 MED ORDER — EMGALITY 120 MG/ML ~~LOC~~ SOAJ: 120.0000 mg | SUBCUTANEOUS | 2 refills | Status: DC

## 2018-12-14 MED ORDER — SERTRALINE HCL 100 MG PO TABS: 100.0000 mg | ORAL_TABLET | Freq: Every day | ORAL | 1 refills | Status: DC

## 2018-12-14 MED ORDER — TRAZODONE HCL 50 MG PO TABS: 50.0000 mg | ORAL_TABLET | Freq: Every day | ORAL | 1 refills | Status: DC

## 2018-12-14 NOTE — Progress Notes (Signed)
Has not checked fasting blood sugar today Had A1C last month - 6.7 - [facility] follows   Needs refills on medications  Having headaches, have been going on for a year since head injury last December.  Has been given amitriptyline, 2 other medications she doesn't remember, to treat but didn't work.  She states daily persistent headaches Nothing makes worse or better, just always there.

## 2018-12-14 NOTE — Progress Notes (Signed)
Patient ID: Valerie Pratt, female   DOB: 11/06/1965, 53 y.o.   MRN: [id number] .Marland KitchenVirtual Visit via Video Note  I connected with Valerie Pratt on 12/14/18 at 10:30 AM EST by a video enabled telemedicine application and verified that I am speaking with the correct person using two identifiers.  Location: Patient: home Provider: clinic   I discussed the limitations of evaluation and management by telemedicine and the availability of in person appointments. The patient expressed understanding and agreed to proceed.  History of Present Illness: Patient is a 53 year old female with COPD, chronic pancreatitis, type 1 diabetes, dyslipidemia, GAD and insomnia who presents to the clinic for follow-up.  Patient follows up with endocrinology for diabetes.  She did not check her fasting blood glucose today but reports her last A1c was 6.7 at tried endocrine.  Patient is doing great with her mood.  She denies any suicidal or homicidal thoughts.  She is compliant with medication.  She does need refills.  Patient's breathing is well controlled.  She uses as needed albuterol but that is usually just once or so a week.  She is sleeping well on trazodone.  She wakes up feeling well rested.  Her only concern is her headaches.  Patient has been having daily headaches since her closed skull fracture over a year ago. Located over the occipital head and dull.  She has tried amitriptyline and Topamax with no benefit.  Ibuprofen and Tylenol do not touch the headache.  At times it will wax and wane to not be as severe.  Patient denies any ear pain, hearing loss. She does have some ringing in ears ongoing. Most days pain is 5/10. She denies any vision changes, nausea, light or sound sensitivity.   .. Active Ambulatory Problems    Diagnosis Date Noted  . History of pancreatitis 05/29/2015  . Constipation 05/29/2015  . Cough 05/29/2015  . Unintentional weight loss 05/29/2015  . Elevated random blood glucose  level 05/29/2015  . Type I diabetes mellitus (HCC) 06/04/2015  . 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 Current smoker 08/26/2015  . Chronic obstructive pulmonary disease (HCC) 08/26/2015  . Depression 08/26/2015  . Former smoker 09/30/2015  . Sleeping difficulties 09/30/2015  . GAD (generalized anxiety disorder) 12/16/2015  . Breast cancer screening 01/13/2016  . Screening for lipid disorders 01/13/2016  . Colon cancer screening 01/13/2016  . Nuclear sclerotic cataract of both eyes 01/15/2016  . Chronic pancreatitis (HCC) 06/10/2017  . Dyslipidemia, goal LDL below 70 06/10/2017  . Traumatic wound dehiscence 08/27/2017  . Forearm laceration with complication, left, sequela 08/27/2017  . Cerebral venous sinus thrombosis 01/14/2018  . Ankle edema, bilateral 01/14/2018  . Closed skull fracture (HCC) 01/14/2018  . Syncope and collapse 01/14/2018  . Chronic post-traumatic headache, not intractable 01/14/2018  . Weight gain 01/14/2018  . Orthostatic hypotension 01/14/2018  . Memory changes 01/14/2018  . Intractable chronic post-traumatic headache 12/20/2018   Resolved Ambulatory Problems    Diagnosis Date Noted  . Excessive thirst 05/29/2015   Past Medical History:  Diagnosis Date  . Anxiety   . COPD (chronic obstructive pulmonary disease) (HCC)   . Diabetes mellitus without complication (HCC)   . Insomnia    Reviewed med, allergy, problem list.    Observations/Objective: No acute distress Normal mood and appearance.  Normal breathing.   .. Today's Vitals   12/14/18 0859  Temp: 97.6 F (36.4 C)  TempSrc: Oral  Weight: 115 lb (52.2 kg)  Height: 5\' 8"  (1.727 m)   Body mass index is 17.49 kg/m.  .. Depression screen [facility] 2/9 12/14/2018 01/12/2018 01/12/2018 06/09/2017 06/09/2016  Decreased Interest 1 1 1 2  0  Down, Depressed, Hopeless 1 - 1 3 1   PHQ - 2 Score 2 1 2 5 1   Altered sleeping 1 - - 3 2  Tired, decreased  energy 1 - - 2 1  Change in appetite 0 - - 1 1  Feeling bad or failure about yourself  0 - 0 2 1  Trouble concentrating 1 - 1 1 2   Moving slowly or fidgety/restless 0 - 1 1 1   Suicidal thoughts 0 - 0 0 0  PHQ-9 Score 5 - - 15 9  Difficult doing work/chores Somewhat difficult - Somewhat difficult Somewhat difficult -   .. GAD 7 : Generalized Anxiety Score 12/14/2018 01/12/2018 06/09/2017 06/09/2016  Nervous, Anxious, on Edge 1 1 3 1   Control/stop worrying 1 1 3  0  Worry too much - different things 1 2 3  0  Trouble relaxing 1 2 2 1   Restless 1 2 2 1   Easily annoyed or irritable 0 0 2 0  Afraid - awful might happen 1 0 2 0  Total GAD 7 Score 6 8 17 3   Anxiety Difficulty Somewhat difficult Not difficult at all Very difficult Not difficult at all       Assessment and Plan: Marland KitchenMarland KitchenMaritta was seen today for depression.  Diagnoses and all orders for this visit:  Intractable chronic post-traumatic headache -     Galcanezumab-gnlm (EMGALITY) 120 MG/ML SOAJ; Inject 240 mg into the skin once for 1 dose. Decrease to 120mg  monthly for future dosing. -     Galcanezumab-gnlm (EMGALITY) 120 MG/ML SOAJ; Inject 120 mg into the skin every 30 (thirty) days.  Sleeping difficulties -     traZODone (DESYREL) 50 MG tablet; Take 1-2 tablets (50-100 mg total) by mouth at bedtime.  Hyperlipidemia LDL goal <70 -     pravastatin (PRAVACHOL) 20 MG tablet; Take 1 tablet (20 mg total) by mouth daily.  GAD (generalized anxiety disorder) -     sertraline (ZOLOFT) 100 MG tablet; Take 1 tablet (100 mg total) by mouth daily.  Moderate episode of recurrent major depressive disorder (HCC) -     sertraline (ZOLOFT) 100 MG tablet; Take 1 tablet (100 mg total) by mouth daily.  Other orders -     PROAIR HFA 108 (90 Base) MCG/ACT inhaler; INHALE 2 PUFFS INTO LUNGS EVERY 6 HOURS AS NEEDED FOR WHEEZING FOR SHORTNESS OF BREATH. NEEDS APPOINTMENT   Medications refilled.  PHQ and GAD stable.  Labs up to date.  Endocrinology  monitors DM.   Headaches will try emgality. Sent loading dose. Follow up in 3 months. Failed topamax and amitriptyline. Consider chiropractic care.     Marland Kitchen.Spent 30 minutes with patient and greater than 50 percent of visit spent counseling patient regarding treatment plan.    Follow Up Instructions:    I discussed the assessment and treatment plan with the patient. The patient was provided an opportunity to ask questions and all were answered. The patient agreed with the plan and demonstrated an understanding of the instructions.   The patient was advised to call back or seek an in-person evaluation if the symptoms worsen or if the condition fails to improve as anticipated.   Iran Planas, PA-C

## 2018-12-19 DIAGNOSIS — S066X1D Traumatic subarachnoid hemorrhage with loss of consciousness of 30 minutes or less, subsequent encounter: Secondary | ICD-10-CM | POA: Diagnosis not present

## 2018-12-19 DIAGNOSIS — Z7901 Long term (current) use of anticoagulants: Secondary | ICD-10-CM | POA: Diagnosis not present

## 2018-12-19 DIAGNOSIS — G08 Intracranial and intraspinal phlebitis and thrombophlebitis: Secondary | ICD-10-CM | POA: Diagnosis not present

## 2018-12-20 ENCOUNTER — Encounter: Payer: Self-pay | Admitting: Physician Assistant

## 2018-12-20 DIAGNOSIS — G44321 Chronic post-traumatic headache, intractable: Secondary | ICD-10-CM | POA: Insufficient documentation

## 2019-01-16 DIAGNOSIS — G08 Intracranial and intraspinal phlebitis and thrombophlebitis: Secondary | ICD-10-CM | POA: Diagnosis not present

## 2019-01-16 DIAGNOSIS — Z7901 Long term (current) use of anticoagulants: Secondary | ICD-10-CM | POA: Diagnosis not present

## 2019-01-16 DIAGNOSIS — S066X1D Traumatic subarachnoid hemorrhage with loss of consciousness of 30 minutes or less, subsequent encounter: Secondary | ICD-10-CM | POA: Diagnosis not present

## 2019-01-24 DIAGNOSIS — E109 Type 1 diabetes mellitus without complications: Secondary | ICD-10-CM | POA: Diagnosis not present

## 2019-01-24 LAB — BASIC METABOLIC PANEL
BUN: 23 — AB (ref 4–21)
CO2: 26 — AB (ref 13–22)
Chloride: 102 (ref 99–108)
Creatinine: 0.7 (ref <=1.1)
Glucose: 91
Potassium: 4 (ref 3.4–5.3)
Sodium: 142 (ref 137–147)

## 2019-01-24 LAB — HEPATIC FUNCTION PANEL
ALT: 24 (ref 7–35)
AST: 40 — AB (ref 13–35)
Alkaline Phosphatase: 86 (ref 25–125)
Bilirubin, Total: 0.5

## 2019-01-24 LAB — LIPID PANEL
Cholesterol: 169 (ref 0–200)
HDL: 86 — AB (ref 35–70)
LDL Cholesterol: 70
Triglycerides: 65 (ref 40–160)

## 2019-01-24 LAB — MICROALBUMIN, URINE: Microalb, Ur: <5

## 2019-01-24 LAB — HEMOGLOBIN A1C: Hemoglobin A1C: 7.2

## 2019-01-26 DIAGNOSIS — K860 Alcohol-induced chronic pancreatitis: Secondary | ICD-10-CM | POA: Diagnosis not present

## 2019-01-26 DIAGNOSIS — E109 Type 1 diabetes mellitus without complications: Secondary | ICD-10-CM | POA: Diagnosis not present

## 2019-02-06 ENCOUNTER — Telehealth (INDEPENDENT_AMBULATORY_CARE_PROVIDER_SITE_OTHER): Payer: Medicaid Other | Admitting: Physician Assistant

## 2019-02-06 ENCOUNTER — Encounter: Payer: Self-pay | Admitting: Physician Assistant

## 2019-02-06 VITALS — Ht 68.0 in | Wt 115.0 lb

## 2019-02-06 DIAGNOSIS — L03311 Cellulitis of abdominal wall: Secondary | ICD-10-CM | POA: Diagnosis not present

## 2019-02-06 DIAGNOSIS — T8130XA Disruption of wound, unspecified, initial encounter: Secondary | ICD-10-CM

## 2019-02-06 DIAGNOSIS — X58XXXA Exposure to other specified factors, initial encounter: Secondary | ICD-10-CM | POA: Diagnosis not present

## 2019-02-06 MED ORDER — DOXYCYCLINE HYCLATE 100 MG PO TABS: 100.0000 mg | ORAL_TABLET | Freq: Two times a day (BID) | ORAL | 0 refills | Status: DC

## 2019-02-06 NOTE — Telephone Encounter (Signed)
Can we call patient and get on schedule to do virtual visit so I can ask more questions etc.

## 2019-02-06 NOTE — Progress Notes (Deleted)
Red, irriated skin on stomach x 1 week - pictures on mychart No changes in soaps/detergent/medications No new animals in home/ no new clothes  No itching/pain Has had surgeries, so does not have feeling in area  Has not used any OTC meds or creams on area

## 2019-02-07 ENCOUNTER — Encounter: Payer: Self-pay | Admitting: Physician Assistant

## 2019-02-07 DIAGNOSIS — L03311 Cellulitis of abdominal wall: Secondary | ICD-10-CM | POA: Insufficient documentation

## 2019-02-07 DIAGNOSIS — T8130XA Disruption of wound, unspecified, initial encounter: Secondary | ICD-10-CM | POA: Insufficient documentation

## 2019-02-07 NOTE — Progress Notes (Addendum)
Patient ID: Valerie Pratt, female   DOB: 1965-05-19, 54 y.o.   MRN: [id number] .Valerie KitchenVirtual Visit via Video Note  I connected with Valerie Pratt on 02/06/2019 at  2:40 PM EST by a video enabled telemedicine application and verified that I am speaking with the correct person using two identifiers.  Location: Patient: home Provider: clinic   I discussed the limitations of evaluation and management by telemedicine and the availability of in person appointments. The patient expressed understanding and agreed to proceed.  History of Present Illness: Pt is a 54 yo female with T1DM and hx of abdominal surgery in 2010 with chronic abdominal hernias and disfigurement who presents to the clinic with some redness in abdomen and appearance of the umbilicus area being irritated. She has had this once before and antibiotic treated it. She denies any new trauma. She does not have sensation in area but no pain or pressure. Her abdominal are is very red and warm to touch. Red area seems to be enlarging. No itching. No new creams or lotions. No changes in soaps, detergents, medication. No fever, chills, n/v/d. No bowel changes.   .. Active Ambulatory Problems    Diagnosis Date Noted  . History of pancreatitis 05/29/2015  . Constipation 05/29/2015  . Cough 05/29/2015  . Unintentional weight loss 05/29/2015  . Elevated random blood glucose level 05/29/2015  . Type I diabetes mellitus ([place]) 06/04/2015  . 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 Current smoker 08/26/2015  . Chronic obstructive pulmonary disease ([place]) 08/26/2015  . Depression 08/26/2015  . Former smoker 09/30/2015  . Sleeping difficulties 09/30/2015  . GAD (generalized anxiety disorder) 12/16/2015  . Breast cancer screening 01/13/2016  . Screening for lipid disorders 01/13/2016  . Colon cancer screening 01/13/2016  . Nuclear sclerotic cataract of both eyes 01/15/2016  . Chronic  pancreatitis ([place]) 06/10/2017  . Dyslipidemia, goal LDL below 70 06/10/2017  . Traumatic wound dehiscence 08/27/2017  . Forearm laceration with complication, left, sequela 08/27/2017  . Cerebral venous sinus thrombosis 01/14/2018  . Ankle edema, bilateral 01/14/2018  . Closed skull fracture ([place]) 01/14/2018  . Syncope and collapse 01/14/2018  . Chronic post-traumatic headache, not intractable 01/14/2018  . Weight gain 01/14/2018  . Orthostatic hypotension 01/14/2018  . Memory changes 01/14/2018  . Intractable chronic post-traumatic headache 12/20/2018  . Abdominal wound dehiscence 02/07/2019  . Cellulitis of abdominal wall 02/07/2019   Resolved Ambulatory Problems    Diagnosis Date Noted  . Excessive thirst 05/29/2015   Past Medical History:  Diagnosis Date  . Anxiety   . COPD (chronic obstructive pulmonary disease) ([place])   . Diabetes mellitus without complication ([place])   . Insomnia    Reviewed med, allergy, problem list.     Observations/Objective: No acute distress. Picture in mychart center area of dehiscence around umbilicus area with appearance of scab like formation and surround erythema stretching out. No active discharge. Multiple abdominal herniations in all abdominal quadrants.   .. Today's Vitals   02/06/19 1343  Weight: 115 lb (52.2 kg)  Height: 5\' 8"  (1.727 m)   Body mass index is 17.49 kg/m.    Assessment and Plan: Valerie KitchenMarland KitchenDerrian was seen today for rash.  Diagnoses and all orders for this visit:  Abdominal wound dehiscence, initial encounter -     doxycycline (VIBRA-TABS) 100 MG tablet; Take 1 tablet (100 mg total) by mouth 2 (two) times daily.  Cellulitis of abdominal wall -     doxycycline (VIBRA-TABS) 100 MG  tablet; Take 1 tablet (100 mg total) by mouth 2 (two) times daily.  concern for abdominal infection/cellulitis. Treated with doxycycline. Use warm compresses over abdominal. ABX can increase bleeding risk on coumadin. Cut coumadin in half 10  days while on doxycycline. Get INR checked in 10 days. Discussed with patient if wound does not completely heal up wound clinic potentially could intervene to prevent future infections. Red flag of infections discussed and when to follow up.   Spent 10 minutes with patient and with chart review.     Follow Up Instructions:    I discussed the assessment and treatment plan with the patient. The patient was provided an opportunity to ask questions and all were answered. The patient agreed with the plan and demonstrated an understanding of the instructions.   The patient was advised to call back or seek an in-person evaluation if the symptoms worsen or if the condition fails to improve as anticipated.    Valerie Gaw, PA-C

## 2019-02-10 DIAGNOSIS — Z7901 Long term (current) use of anticoagulants: Secondary | ICD-10-CM | POA: Diagnosis not present

## 2019-02-10 DIAGNOSIS — S066X1D Traumatic subarachnoid hemorrhage with loss of consciousness of 30 minutes or less, subsequent encounter: Secondary | ICD-10-CM | POA: Diagnosis not present

## 2019-02-10 DIAGNOSIS — G08 Intracranial and intraspinal phlebitis and thrombophlebitis: Secondary | ICD-10-CM | POA: Diagnosis not present

## 2019-03-10 DIAGNOSIS — G08 Intracranial and intraspinal phlebitis and thrombophlebitis: Secondary | ICD-10-CM | POA: Diagnosis not present

## 2019-03-10 DIAGNOSIS — S066X1D Traumatic subarachnoid hemorrhage with loss of consciousness of 30 minutes or less, subsequent encounter: Secondary | ICD-10-CM | POA: Diagnosis not present

## 2019-03-10 DIAGNOSIS — Z7901 Long term (current) use of anticoagulants: Secondary | ICD-10-CM | POA: Diagnosis not present

## 2019-03-31 DIAGNOSIS — G08 Intracranial and intraspinal phlebitis and thrombophlebitis: Secondary | ICD-10-CM | POA: Diagnosis not present

## 2019-03-31 DIAGNOSIS — Z7901 Long term (current) use of anticoagulants: Secondary | ICD-10-CM | POA: Diagnosis not present

## 2019-03-31 DIAGNOSIS — S066X1D Traumatic subarachnoid hemorrhage with loss of consciousness of 30 minutes or less, subsequent encounter: Secondary | ICD-10-CM | POA: Diagnosis not present

## 2019-04-27 ENCOUNTER — Other Ambulatory Visit: Payer: Self-pay | Admitting: Physician Assistant

## 2019-04-27 DIAGNOSIS — Z1239 Encounter for other screening for malignant neoplasm of breast: Secondary | ICD-10-CM

## 2019-04-28 DIAGNOSIS — Z7901 Long term (current) use of anticoagulants: Secondary | ICD-10-CM | POA: Diagnosis not present

## 2019-04-28 DIAGNOSIS — S066X1D Traumatic subarachnoid hemorrhage with loss of consciousness of 30 minutes or less, subsequent encounter: Secondary | ICD-10-CM | POA: Diagnosis not present

## 2019-04-28 DIAGNOSIS — G08 Intracranial and intraspinal phlebitis and thrombophlebitis: Secondary | ICD-10-CM | POA: Diagnosis not present

## 2019-05-02 DIAGNOSIS — H5213 Myopia, bilateral: Secondary | ICD-10-CM | POA: Diagnosis not present

## 2019-05-02 DIAGNOSIS — Z794 Long term (current) use of insulin: Secondary | ICD-10-CM | POA: Diagnosis not present

## 2019-05-02 DIAGNOSIS — E119 Type 2 diabetes mellitus without complications: Secondary | ICD-10-CM | POA: Diagnosis not present

## 2019-05-02 DIAGNOSIS — H524 Presbyopia: Secondary | ICD-10-CM | POA: Diagnosis not present

## 2019-05-04 DIAGNOSIS — H5213 Myopia, bilateral: Secondary | ICD-10-CM | POA: Diagnosis not present

## 2019-05-11 DIAGNOSIS — S066X1D Traumatic subarachnoid hemorrhage with loss of consciousness of 30 minutes or less, subsequent encounter: Secondary | ICD-10-CM | POA: Diagnosis not present

## 2019-05-11 DIAGNOSIS — Z7901 Long term (current) use of anticoagulants: Secondary | ICD-10-CM | POA: Diagnosis not present

## 2019-05-11 DIAGNOSIS — G08 Intracranial and intraspinal phlebitis and thrombophlebitis: Secondary | ICD-10-CM | POA: Diagnosis not present

## 2019-05-21 ENCOUNTER — Other Ambulatory Visit: Payer: Self-pay | Admitting: Physician Assistant

## 2019-06-06 DIAGNOSIS — H5213 Myopia, bilateral: Secondary | ICD-10-CM | POA: Diagnosis not present

## 2019-06-08 DIAGNOSIS — Z7901 Long term (current) use of anticoagulants: Secondary | ICD-10-CM | POA: Diagnosis not present

## 2019-06-08 DIAGNOSIS — G08 Intracranial and intraspinal phlebitis and thrombophlebitis: Secondary | ICD-10-CM | POA: Diagnosis not present

## 2019-06-08 DIAGNOSIS — S066X1D Traumatic subarachnoid hemorrhage with loss of consciousness of 30 minutes or less, subsequent encounter: Secondary | ICD-10-CM | POA: Diagnosis not present

## 2019-06-22 DIAGNOSIS — Z23 Encounter for immunization: Secondary | ICD-10-CM | POA: Diagnosis not present

## 2019-06-26 ENCOUNTER — Other Ambulatory Visit: Payer: Self-pay | Admitting: Physician Assistant

## 2019-06-26 DIAGNOSIS — E785 Hyperlipidemia, unspecified: Secondary | ICD-10-CM

## 2019-06-26 DIAGNOSIS — F411 Generalized anxiety disorder: Secondary | ICD-10-CM

## 2019-06-26 DIAGNOSIS — F331 Major depressive disorder, recurrent, moderate: Secondary | ICD-10-CM

## 2019-07-06 DIAGNOSIS — S066X1D Traumatic subarachnoid hemorrhage with loss of consciousness of 30 minutes or less, subsequent encounter: Secondary | ICD-10-CM | POA: Diagnosis not present

## 2019-07-06 DIAGNOSIS — G08 Intracranial and intraspinal phlebitis and thrombophlebitis: Secondary | ICD-10-CM | POA: Diagnosis not present

## 2019-07-06 DIAGNOSIS — Z7901 Long term (current) use of anticoagulants: Secondary | ICD-10-CM | POA: Diagnosis not present

## 2019-07-25 DIAGNOSIS — Z7901 Long term (current) use of anticoagulants: Secondary | ICD-10-CM | POA: Diagnosis not present

## 2019-07-25 DIAGNOSIS — G08 Intracranial and intraspinal phlebitis and thrombophlebitis: Secondary | ICD-10-CM | POA: Diagnosis not present

## 2019-07-25 DIAGNOSIS — S066X1D Traumatic subarachnoid hemorrhage with loss of consciousness of 30 minutes or less, subsequent encounter: Secondary | ICD-10-CM | POA: Diagnosis not present

## 2019-08-01 ENCOUNTER — Other Ambulatory Visit: Payer: Self-pay | Admitting: Physician Assistant

## 2019-08-01 DIAGNOSIS — G479 Sleep disorder, unspecified: Secondary | ICD-10-CM

## 2019-08-01 DIAGNOSIS — E109 Type 1 diabetes mellitus without complications: Secondary | ICD-10-CM | POA: Diagnosis not present

## 2019-08-04 DIAGNOSIS — E109 Type 1 diabetes mellitus without complications: Secondary | ICD-10-CM | POA: Diagnosis not present

## 2019-08-04 DIAGNOSIS — K860 Alcohol-induced chronic pancreatitis: Secondary | ICD-10-CM | POA: Diagnosis not present

## 2019-08-22 DIAGNOSIS — Z7901 Long term (current) use of anticoagulants: Secondary | ICD-10-CM | POA: Diagnosis not present

## 2019-08-22 DIAGNOSIS — S066X1D Traumatic subarachnoid hemorrhage with loss of consciousness of 30 minutes or less, subsequent encounter: Secondary | ICD-10-CM | POA: Diagnosis not present

## 2019-08-22 DIAGNOSIS — G08 Intracranial and intraspinal phlebitis and thrombophlebitis: Secondary | ICD-10-CM | POA: Diagnosis not present

## 2019-09-07 ENCOUNTER — Other Ambulatory Visit: Payer: Self-pay | Admitting: Physician Assistant

## 2019-09-07 DIAGNOSIS — G479 Sleep disorder, unspecified: Secondary | ICD-10-CM

## 2019-09-19 DIAGNOSIS — Z7901 Long term (current) use of anticoagulants: Secondary | ICD-10-CM | POA: Diagnosis not present

## 2019-09-19 DIAGNOSIS — S066X1D Traumatic subarachnoid hemorrhage with loss of consciousness of 30 minutes or less, subsequent encounter: Secondary | ICD-10-CM | POA: Diagnosis not present

## 2019-09-19 DIAGNOSIS — G08 Intracranial and intraspinal phlebitis and thrombophlebitis: Secondary | ICD-10-CM | POA: Diagnosis not present

## 2019-09-25 ENCOUNTER — Telehealth (INDEPENDENT_AMBULATORY_CARE_PROVIDER_SITE_OTHER): Payer: Medicaid Other | Admitting: Physician Assistant

## 2019-09-25 VITALS — Ht 68.0 in | Wt 118.0 lb

## 2019-09-25 DIAGNOSIS — G44221 Chronic tension-type headache, intractable: Secondary | ICD-10-CM

## 2019-09-25 DIAGNOSIS — F411 Generalized anxiety disorder: Secondary | ICD-10-CM

## 2019-09-25 DIAGNOSIS — M5030 Other cervical disc degeneration, unspecified cervical region: Secondary | ICD-10-CM | POA: Diagnosis not present

## 2019-09-25 DIAGNOSIS — F331 Major depressive disorder, recurrent, moderate: Secondary | ICD-10-CM | POA: Diagnosis not present

## 2019-09-25 DIAGNOSIS — G479 Sleep disorder, unspecified: Secondary | ICD-10-CM | POA: Diagnosis not present

## 2019-09-25 MED ORDER — BACLOFEN 10 MG PO TABS: 10.0000 mg | ORAL_TABLET | Freq: Three times a day (TID) | ORAL | 2 refills | Status: DC

## 2019-09-25 MED ORDER — SERTRALINE HCL 100 MG PO TABS: 100.0000 mg | ORAL_TABLET | Freq: Every day | ORAL | 3 refills | Status: DC

## 2019-09-25 MED ORDER — TRAZODONE HCL 50 MG PO TABS: ORAL_TABLET | ORAL | 1 refills | Status: DC

## 2019-09-25 NOTE — Progress Notes (Signed)
Patient ID: Valerie Pratt, female   DOB: Mar 24, 1965, 54 y.o.   MRN: [id number] .Marland KitchenVirtual Visit via Video Note  I connected with Valerie Pratt on 09/25/2019 at 10:30 AM EDT by a video enabled telemedicine application and verified that I am speaking with the correct person using two identifiers.  Location: Patient: home Provider: clinic   I discussed the limitations of evaluation and management by telemedicine and the availability of in person appointments. The patient expressed understanding and agreed to proceed.  History of Present Illness: Patient is a 54 year old female with type 1 diabetes, COPD, chronic pancreatitis, major depression, generalized anxiety, chronic tension type headaches who calls into the clinic for medication refills.  Patient's chronic pancreatitis and type 1 diabetes is controlled by endocrinology.  She reports her last A1c being in the sixes.  Her mood is very well controlled on Zoloft.  She does need a refill.  She denies any concerns or complaints.  She is sleeping well with trazodone.  She continues to have daily headaches.  She has tried numerous medications to prevent these and they have not worked.  She started [facility] and it seemed to help for the first week and then started having headaches again.  She has headaches 80% of the month.  She denies any nausea, vomiting, vision changes, GI upset.  She has left-sided head pain that radiates into her neck.  She has constant neck tightness and pain.  She has tried Tylenol but does not seem to help either.  She does have diagnosed degenerative changes in her cervical spine.  She has never seen anybody to consider interventional planning.  MRI 09/2018 IMPRESSION:  1. Cervical spondylosis and facet arthrosis as described above. This is most notable for severe RIGHT and moderate LEFT neuroforaminal stenosis and mild spinal canal narrowing at C4-C5.  2. Moderate LEFT and mild RIGHT neuroforaminal stenosis and mild  spinal canal narrowing at C6-C7.     .. Active Ambulatory Problems    Diagnosis Date Noted  . History of pancreatitis 05/29/2015  . Constipation 05/29/2015  . Cough 05/29/2015  . Unintentional weight loss 05/29/2015  . Elevated random blood glucose level 05/29/2015  . Type I diabetes mellitus (HCC) 06/04/2015  . 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 Current smoker 08/26/2015  . Chronic obstructive pulmonary disease (HCC) 08/26/2015  . Depression 08/26/2015  . Former smoker 09/30/2015  . Sleeping difficulties 09/30/2015  . GAD (generalized anxiety disorder) 12/16/2015  . Breast cancer screening 01/13/2016  . Screening for lipid disorders 01/13/2016  . Colon cancer screening 01/13/2016  . Nuclear sclerotic cataract of both eyes 01/15/2016  . Chronic pancreatitis (HCC) 06/10/2017  . Dyslipidemia, goal LDL below 70 06/10/2017  . Traumatic wound dehiscence 08/27/2017  . Forearm laceration with complication, left, sequela 08/27/2017  . Cerebral venous sinus thrombosis 01/14/2018  . Ankle edema, bilateral 01/14/2018  . Closed skull fracture (HCC) 01/14/2018  . Syncope and collapse 01/14/2018  . Chronic post-traumatic headache, not intractable 01/14/2018  . Weight gain 01/14/2018  . Orthostatic hypotension 01/14/2018  . Memory changes 01/14/2018  . Intractable chronic post-traumatic headache 12/20/2018  . Abdominal wound dehiscence 02/07/2019  . Cellulitis of abdominal wall 02/07/2019  . Chronic tension-type headache, intractable 09/27/2019  . DDD (degenerative disc disease), cervical 09/27/2019   Resolved Ambulatory Problems    Diagnosis Date Noted  . Excessive thirst 05/29/2015   Past Medical History:  Diagnosis Date  . Anxiety   . COPD (chronic obstructive pulmonary  disease) (HCC)   . Diabetes mellitus without complication (HCC)   . Insomnia    Reviewed med, allergy, problem list.    Observations/Objective: No  acute distress Normal mood  No labored breathing.   .. Today's Vitals   09/25/19 1024  Weight: 118 lb (53.5 kg)  Height: 5\' 8"  (1.727 m)   Body mass index is 17.94 kg/m.  Assessment and Plan: Marland KitchenSoni was seen today for headache.  Diagnoses and all orders for this visit:  Chronic tension-type headache, intractable -     baclofen (LIORESAL) 10 MG tablet; Take 1 tablet (10 mg total) by mouth 3 (three) times daily. -     Ambulatory referral to Orthopedic Surgery  GAD (generalized anxiety disorder) -     sertraline (ZOLOFT) 100 MG tablet; Take 1 tablet (100 mg total) by mouth daily.  Moderate episode of recurrent major depressive disorder (HCC) -     sertraline (ZOLOFT) 100 MG tablet; Take 1 tablet (100 mg total) by mouth daily.  Sleeping difficulties -     traZODone (DESYREL) 50 MG tablet; TAKE 1 TO 2 TABLETS BY MOUTH ONCE DAILY AT BEDTIME.  DDD (degenerative disc disease), cervical -     baclofen (LIORESAL) 10 MG tablet; Take 1 tablet (10 mg total) by mouth 3 (three) times daily. -     Ambulatory referral to Orthopedic Surgery   Refilled Zoloft and trazodone.  Follow-up in 1 year  Patient given muscle relaxer to take at bedtime to help with some of the tension headache.  I do think she could benefit from seeing orthopedic to see options for her degeneration and cervical spine. Perhaps treating this could help with headaches.  MRI 09/2018 IMPRESSION:  1. Cervical spondylosis and facet arthrosis as described above. This is most notable for severe RIGHT and moderate LEFT neuroforaminal stenosis and mild spinal canal narrowing at C4-C5.  2. Moderate LEFT and mild RIGHT neuroforaminal stenosis and mild spinal canal narrowing at C6-C7.     Follow Up Instructions:    I discussed the assessment and treatment plan with the patient. The patient was provided an opportunity to ask questions and all were answered. The patient agreed with the plan and demonstrated an understanding  of the instructions.   The patient was advised to call back or seek an in-person evaluation if the symptoms worsen or if the condition fails to improve as anticipated.  I provided 15 minutes of non-face-to-face time during this encounter.   10/2018, PA-C

## 2019-09-25 NOTE — Progress Notes (Signed)
Headaches approx 80% of the time No nausea/vomiting No sensitivity to light/sound Takes tylenol - no help Emgality for 3 months (2-3 days in a month it helps, otherwise no help)

## 2019-09-25 NOTE — Patient Instructions (Signed)
Tension Headache, Adult A tension headache is pain, pressure, or aching in your head. Tension headaches can last from 30 minutes to several days. Follow these instructions at home: Managing pain  Take over-the-counter and prescription medicines only as told by your doctor.  When you have a headache, lie down in a dark, quiet room.  If told, put ice on your head and neck: ? Put ice in a plastic bag. ? Place a towel between your skin and the bag. ? Leave the ice on for 20 minutes, 2-3 times a day.  If told, put heat on the back of your neck. Do this as often as your doctor tells you to. Use the kind of heat that your doctor recommends, such as a moist heat pack or a heating pad. ? Place a towel between your skin and the heat. ? Leave the heat on for 20-30 minutes. ? Remove the heat if your skin turns bright red. Eating and drinking  Eat meals on a regular schedule.  Watch how much alcohol you drink: ? If you are a woman and are not pregnant, do not drink more than 1 drink a day. ? If you are a man, do not drink more than 2 drinks a day.  Drink enough fluid to keep your pee (urine) pale yellow.  Do not use a lot of caffeine, or stop using caffeine. Lifestyle  Get enough sleep. Get 7-9 hours of sleep each night. Or get the amount of sleep that your doctor tells you to.  At bedtime, remove all electronic devices from your room. Examples of electronic devices are computers, phones, and tablets.  Find ways to lessen your stress. Some things that can lessen stress are: ? Exercise. ? Deep breathing. ? Yoga. ? Music. ? Positive thoughts.  Sit up straight. Do not tighten (tense) your muscles.  Do not use any products that have nicotine or tobacco in them, such as cigarettes and e-cigarettes. If you need help quitting, ask your doctor. General instructions   Keep all follow-up visits as told by your doctor. This is important.  Avoid things that can bring on headaches. Keep a  journal to find out if certain things bring on headaches. For example, write down: ? What you eat and drink. ? How much sleep you get. ? Any change to your diet or medicines. Contact a doctor if:  Your headache does not get better.  Your headache comes back.  You have a headache and sounds, light, or smells bother you.  You feel sick to your stomach (nauseous) or you throw up (vomit).  Your stomach hurts. Get help right away if:  You suddenly get a very bad headache along with any of these: ? A stiff neck. ? Feeling sick to your stomach. ? Throwing up. ? Feeling weak. ? Trouble seeing. ? Feeling short of breath. ? A rash. ? Feeling unusually sleepy. ? Trouble speaking. ? Pain in your eye or ear. ? Trouble walking or balancing. ? Feeling like you will pass out (faint). ? Passing out. Summary  A tension headache is pain, pressure, or aching in your head.  Tension headaches can last from 30 minutes to several days.  Lifestyle changes and medicines may help relieve pain. This information is not intended to replace advice given to you by your health care provider. Make sure you discuss any questions you have with your health care provider. Document Revised: 10/19/2018 Document Reviewed: 04/03/2016 Elsevier Patient Education  2020 Elsevier Inc.  

## 2019-09-27 ENCOUNTER — Encounter: Payer: Self-pay | Admitting: Physician Assistant

## 2019-09-27 ENCOUNTER — Other Ambulatory Visit: Payer: Self-pay | Admitting: Physician Assistant

## 2019-09-27 DIAGNOSIS — M5030 Other cervical disc degeneration, unspecified cervical region: Secondary | ICD-10-CM | POA: Insufficient documentation

## 2019-09-27 DIAGNOSIS — E785 Hyperlipidemia, unspecified: Secondary | ICD-10-CM

## 2019-09-27 DIAGNOSIS — G44221 Chronic tension-type headache, intractable: Secondary | ICD-10-CM | POA: Insufficient documentation

## 2019-10-01 ENCOUNTER — Other Ambulatory Visit: Payer: Self-pay | Admitting: Physician Assistant

## 2019-10-02 ENCOUNTER — Ambulatory Visit: Payer: Medicaid Other | Admitting: Family Medicine

## 2019-10-03 DIAGNOSIS — S066X1D Traumatic subarachnoid hemorrhage with loss of consciousness of 30 minutes or less, subsequent encounter: Secondary | ICD-10-CM | POA: Diagnosis not present

## 2019-10-03 DIAGNOSIS — G08 Intracranial and intraspinal phlebitis and thrombophlebitis: Secondary | ICD-10-CM | POA: Diagnosis not present

## 2019-10-03 DIAGNOSIS — Z7901 Long term (current) use of anticoagulants: Secondary | ICD-10-CM | POA: Diagnosis not present

## 2019-10-04 ENCOUNTER — Encounter: Payer: Self-pay | Admitting: Orthopaedic Surgery

## 2019-10-04 ENCOUNTER — Ambulatory Visit (INDEPENDENT_AMBULATORY_CARE_PROVIDER_SITE_OTHER): Payer: Medicaid Other | Admitting: Orthopaedic Surgery

## 2019-10-04 DIAGNOSIS — M4802 Spinal stenosis, cervical region: Secondary | ICD-10-CM | POA: Diagnosis not present

## 2019-10-04 NOTE — Progress Notes (Signed)
Office Visit Note   Patient: Valerie Pratt           Date of Birth: [birth year]           MRN: [id number] Visit Date: 10/04/2019              Requested by: Jomarie Longs, PA-C [address] [place],  [place] [address] PCP: Jomarie Longs, PA-C   Assessment & Plan: Visit Diagnoses:  1. Foraminal stenosis of cervical region     Plan: We discussed currently she has no radicular symptoms despite scan that was done at [facility] which showed significant foraminal stenosis.  She develops radicular symptoms she can return.  She has recurrent symptoms she can bring a copy of her disc for review.  We reviewed the report and I left a copy of the report with her.  Follow-Up Instructions: Return if symptoms worsen or fail to improve.   Orders:  No orders of the defined types were placed in this encounter.  No orders of the defined types were placed in this encounter.     Procedures: No procedures performed   Clinical Data: No additional findings.   Subjective: Chief Complaint  Patient presents with  . Neck - Pain    HPI 54 year old female with problems with chronic headaches.  She has syncopal episode skull fracture has had chronic headaches since that time.  She states on the CT scan of her head she had some venous thrombosis and has been on chronic anticoagulation.  Cervical MRI scan done in September 2020 showed cervical spondylosis with severe right and moderate left neuroforaminal stenosis at C4-5 and mild central stenosis.  There was moderate right and left neuroforaminal stenosis at C8 6-7.  Moderate changes at C5-6 as well.  She denies any numbness in her fingers or hands no problems with gait disturbance.  Past history of pancreatitis with some damage requiring now long-term insulin she takes some Lantus 9 units plus a sliding scale usually 2 units.  A1c has been 7-7.2 in the last year.  Patient's had headaches and is seeing a neurologist.  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  Review of Systems positive for skull fracture due to syncope x2.  COPD chronic pancreatitis past history of alcohol abuse with pancreatic damage and secondary diabetes.  Patient is on subcu chronic insulin Lantus and sliding scale.   Objective: Vital Signs: BP 117/78   Pulse 67   Physical Exam Constitutional:      Appearance: She is well-developed.  HENT:     Head: Normocephalic.     Right Ear: External ear normal.     Left Ear: External ear normal.  Eyes:     Pupils: Pupils are equal, round, and reactive to light.  Neck:     Thyroid: No thyromegaly.     Trachea: No tracheal deviation.  Cardiovascular:     Rate and Rhythm: Normal rate.  Pulmonary:     Effort: Pulmonary effort is normal.  Abdominal:     Palpations: Abdomen is soft.  Skin:    General: Skin is warm and dry.  Neurological:     Mental Status: She is alert and oriented to person, place, and time.  Psychiatric:        Behavior: Behavior normal.     Ortho Exam patient has 2+ symmetrical upper extremity reflexes no isolated motor weakness upper extremities no brachial plexus tenderness full cervical range of  motion.  No pain with cervical compression and no change with distraction.  No lower extremity clonus.  Specialty Comments:  No specialty comments available.  Imaging: No results found.   PMFS History: Patient Active Problem List   Diagnosis Date Noted  . Foraminal stenosis of cervical region 10/04/2019  . Chronic tension-type headache, intractable 09/27/2019  . DDD (degenerative disc disease), cervical 09/27/2019  . Abdominal wound dehiscence 02/07/2019  . Cellulitis of abdominal wall 02/07/2019  . Intractable chronic post-traumatic headache 12/20/2018  . Cerebral venous sinus thrombosis 01/14/2018  . Ankle edema, bilateral 01/14/2018  . Closed skull fracture (HCC) 01/14/2018  . Syncope and collapse 01/14/2018  . Chronic  post-traumatic headache, not intractable 01/14/2018  . Weight gain 01/14/2018  . Orthostatic hypotension 01/14/2018  . Memory changes 01/14/2018  . Traumatic wound dehiscence 08/27/2017  . Forearm laceration with complication, left, sequela 08/27/2017  . Chronic pancreatitis (HCC) 06/10/2017  . Dyslipidemia, goal LDL below 70 06/10/2017  . Nuclear sclerotic cataract of both eyes 01/15/2016  . Breast cancer screening 01/13/2016  . Screening for lipid disorders 01/13/2016  . Colon cancer screening 01/13/2016  . GAD (generalized anxiety disorder) 12/16/2015  . Former smoker 09/30/2015  . Sleeping difficulties 09/30/2015  . Current smoker 08/26/2015  . Chronic obstructive pulmonary disease (HCC) 08/26/2015  . Depression 08/26/2015  . 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 Type I diabetes mellitus (HCC) 06/04/2015  . History of pancreatitis 05/29/2015  . Constipation 05/29/2015  . Cough 05/29/2015  . Unintentional weight loss 05/29/2015  . Elevated random blood glucose level 05/29/2015   Past Medical History:  Diagnosis Date  . Anxiety   . COPD (chronic obstructive pulmonary disease) (HCC)   . Diabetes mellitus without complication (HCC)   . Insomnia     Family History  Problem Relation Age of Onset  . Cancer Mother        bladder  . Cancer Father        leukemia    History reviewed. No pertinent surgical history. Social History   Occupational History  . Not on file  Tobacco Use  . Smoking status: Former Smoker    Types: Cigarettes    Quit date: 09/15/2015    Years since quitting: 4.0  . Smokeless tobacco: Never Used  Substance and Sexual Activity  . Alcohol use: Not Currently    Alcohol/week: 0.0 standard drinks  . Drug use: No  . Sexual activity: Never

## 2019-10-22 ENCOUNTER — Other Ambulatory Visit: Payer: Self-pay | Admitting: Physician Assistant

## 2019-10-22 DIAGNOSIS — G44221 Chronic tension-type headache, intractable: Secondary | ICD-10-CM

## 2019-10-22 DIAGNOSIS — M5030 Other cervical disc degeneration, unspecified cervical region: Secondary | ICD-10-CM

## 2019-10-31 DIAGNOSIS — Z7901 Long term (current) use of anticoagulants: Secondary | ICD-10-CM | POA: Diagnosis not present

## 2019-10-31 DIAGNOSIS — S066X1D Traumatic subarachnoid hemorrhage with loss of consciousness of 30 minutes or less, subsequent encounter: Secondary | ICD-10-CM | POA: Diagnosis not present

## 2019-10-31 DIAGNOSIS — G08 Intracranial and intraspinal phlebitis and thrombophlebitis: Secondary | ICD-10-CM | POA: Diagnosis not present

## 2019-11-02 ENCOUNTER — Other Ambulatory Visit: Payer: Self-pay | Admitting: Physician Assistant

## 2019-11-02 DIAGNOSIS — M5030 Other cervical disc degeneration, unspecified cervical region: Secondary | ICD-10-CM

## 2019-11-02 DIAGNOSIS — G44221 Chronic tension-type headache, intractable: Secondary | ICD-10-CM

## 2019-11-07 ENCOUNTER — Encounter: Payer: Self-pay | Admitting: Physician Assistant

## 2019-11-07 DIAGNOSIS — M5030 Other cervical disc degeneration, unspecified cervical region: Secondary | ICD-10-CM

## 2019-11-07 DIAGNOSIS — G44221 Chronic tension-type headache, intractable: Secondary | ICD-10-CM

## 2019-11-08 MED ORDER — BACLOFEN 10 MG PO TABS: 10.0000 mg | ORAL_TABLET | Freq: Three times a day (TID) | ORAL | 2 refills | Status: DC

## 2019-11-28 DIAGNOSIS — S066X1D Traumatic subarachnoid hemorrhage with loss of consciousness of 30 minutes or less, subsequent encounter: Secondary | ICD-10-CM | POA: Diagnosis not present

## 2019-11-28 DIAGNOSIS — G08 Intracranial and intraspinal phlebitis and thrombophlebitis: Secondary | ICD-10-CM | POA: Diagnosis not present

## 2019-11-28 DIAGNOSIS — Z7901 Long term (current) use of anticoagulants: Secondary | ICD-10-CM | POA: Diagnosis not present

## 2019-12-24 ENCOUNTER — Other Ambulatory Visit: Payer: Self-pay | Admitting: Physician Assistant

## 2019-12-24 DIAGNOSIS — G479 Sleep disorder, unspecified: Secondary | ICD-10-CM

## 2019-12-26 DIAGNOSIS — G08 Intracranial and intraspinal phlebitis and thrombophlebitis: Secondary | ICD-10-CM | POA: Diagnosis not present

## 2019-12-26 DIAGNOSIS — S066X1D Traumatic subarachnoid hemorrhage with loss of consciousness of 30 minutes or less, subsequent encounter: Secondary | ICD-10-CM | POA: Diagnosis not present

## 2019-12-26 DIAGNOSIS — Z7901 Long term (current) use of anticoagulants: Secondary | ICD-10-CM | POA: Diagnosis not present

## 2020-01-01 ENCOUNTER — Other Ambulatory Visit: Payer: Self-pay | Admitting: Physician Assistant

## 2020-01-01 DIAGNOSIS — G479 Sleep disorder, unspecified: Secondary | ICD-10-CM

## 2020-01-01 DIAGNOSIS — E785 Hyperlipidemia, unspecified: Secondary | ICD-10-CM

## 2020-01-04 ENCOUNTER — Encounter: Payer: Self-pay | Admitting: Physician Assistant

## 2020-01-08 ENCOUNTER — Other Ambulatory Visit: Payer: Self-pay | Admitting: *Deleted

## 2020-01-08 DIAGNOSIS — G479 Sleep disorder, unspecified: Secondary | ICD-10-CM

## 2020-01-08 DIAGNOSIS — E785 Hyperlipidemia, unspecified: Secondary | ICD-10-CM

## 2020-01-08 MED ORDER — TRAZODONE HCL 50 MG PO TABS: ORAL_TABLET | ORAL | 1 refills | Status: DC

## 2020-01-08 MED ORDER — PRAVASTATIN SODIUM 20 MG PO TABS: 20.0000 mg | ORAL_TABLET | Freq: Every day | ORAL | 0 refills | Status: DC

## 2020-01-11 NOTE — Telephone Encounter (Signed)
Filled on 01-08-2020

## 2020-02-02 DIAGNOSIS — S066X1D Traumatic subarachnoid hemorrhage with loss of consciousness of 30 minutes or less, subsequent encounter: Secondary | ICD-10-CM | POA: Diagnosis not present

## 2020-02-02 DIAGNOSIS — Z7901 Long term (current) use of anticoagulants: Secondary | ICD-10-CM | POA: Diagnosis not present

## 2020-02-02 DIAGNOSIS — G08 Intracranial and intraspinal phlebitis and thrombophlebitis: Secondary | ICD-10-CM | POA: Diagnosis not present

## 2020-02-04 ENCOUNTER — Other Ambulatory Visit: Payer: Self-pay | Admitting: Physician Assistant

## 2020-02-04 DIAGNOSIS — M5030 Other cervical disc degeneration, unspecified cervical region: Secondary | ICD-10-CM

## 2020-02-04 DIAGNOSIS — G44221 Chronic tension-type headache, intractable: Secondary | ICD-10-CM

## 2020-02-05 DIAGNOSIS — E109 Type 1 diabetes mellitus without complications: Secondary | ICD-10-CM | POA: Diagnosis not present

## 2020-02-05 DIAGNOSIS — S31109A Unspecified open wound of abdominal wall, unspecified quadrant without penetration into peritoneal cavity, initial encounter: Secondary | ICD-10-CM | POA: Diagnosis not present

## 2020-02-05 DIAGNOSIS — K860 Alcohol-induced chronic pancreatitis: Secondary | ICD-10-CM | POA: Diagnosis not present

## 2020-02-26 DIAGNOSIS — T8133XA Disruption of traumatic injury wound repair, initial encounter: Secondary | ICD-10-CM | POA: Diagnosis not present

## 2020-02-26 DIAGNOSIS — K860 Alcohol-induced chronic pancreatitis: Secondary | ICD-10-CM | POA: Diagnosis not present

## 2020-02-26 DIAGNOSIS — E1065 Type 1 diabetes mellitus with hyperglycemia: Secondary | ICD-10-CM | POA: Diagnosis not present

## 2020-02-26 DIAGNOSIS — S31109A Unspecified open wound of abdominal wall, unspecified quadrant without penetration into peritoneal cavity, initial encounter: Secondary | ICD-10-CM | POA: Diagnosis not present

## 2020-02-26 DIAGNOSIS — L089 Local infection of the skin and subcutaneous tissue, unspecified: Secondary | ICD-10-CM | POA: Diagnosis not present

## 2020-03-01 DIAGNOSIS — G08 Intracranial and intraspinal phlebitis and thrombophlebitis: Secondary | ICD-10-CM | POA: Diagnosis not present

## 2020-03-01 DIAGNOSIS — S066X1D Traumatic subarachnoid hemorrhage with loss of consciousness of 30 minutes or less, subsequent encounter: Secondary | ICD-10-CM | POA: Diagnosis not present

## 2020-03-01 DIAGNOSIS — Z7901 Long term (current) use of anticoagulants: Secondary | ICD-10-CM | POA: Diagnosis not present

## 2020-03-04 ENCOUNTER — Other Ambulatory Visit: Payer: Self-pay | Admitting: Physician Assistant

## 2020-03-04 DIAGNOSIS — G44221 Chronic tension-type headache, intractable: Secondary | ICD-10-CM

## 2020-03-04 DIAGNOSIS — M5030 Other cervical disc degeneration, unspecified cervical region: Secondary | ICD-10-CM

## 2020-03-26 DIAGNOSIS — K439 Ventral hernia without obstruction or gangrene: Secondary | ICD-10-CM | POA: Diagnosis not present

## 2020-03-26 DIAGNOSIS — K860 Alcohol-induced chronic pancreatitis: Secondary | ICD-10-CM | POA: Diagnosis not present

## 2020-03-29 DIAGNOSIS — S066X1D Traumatic subarachnoid hemorrhage with loss of consciousness of 30 minutes or less, subsequent encounter: Secondary | ICD-10-CM | POA: Diagnosis not present

## 2020-03-29 DIAGNOSIS — G08 Intracranial and intraspinal phlebitis and thrombophlebitis: Secondary | ICD-10-CM | POA: Diagnosis not present

## 2020-03-29 DIAGNOSIS — Z7901 Long term (current) use of anticoagulants: Secondary | ICD-10-CM | POA: Diagnosis not present

## 2020-04-01 ENCOUNTER — Other Ambulatory Visit: Payer: Self-pay | Admitting: Physician Assistant

## 2020-04-01 DIAGNOSIS — G479 Sleep disorder, unspecified: Secondary | ICD-10-CM

## 2020-04-01 DIAGNOSIS — E785 Hyperlipidemia, unspecified: Secondary | ICD-10-CM

## 2020-04-06 ENCOUNTER — Other Ambulatory Visit: Payer: Self-pay | Admitting: Physician Assistant

## 2020-04-06 DIAGNOSIS — G479 Sleep disorder, unspecified: Secondary | ICD-10-CM

## 2020-04-06 DIAGNOSIS — G44221 Chronic tension-type headache, intractable: Secondary | ICD-10-CM

## 2020-04-06 DIAGNOSIS — E785 Hyperlipidemia, unspecified: Secondary | ICD-10-CM

## 2020-04-06 DIAGNOSIS — M5030 Other cervical disc degeneration, unspecified cervical region: Secondary | ICD-10-CM

## 2020-04-09 ENCOUNTER — Other Ambulatory Visit: Payer: Self-pay | Admitting: Physician Assistant

## 2020-04-09 DIAGNOSIS — G479 Sleep disorder, unspecified: Secondary | ICD-10-CM

## 2020-04-16 ENCOUNTER — Ambulatory Visit: Payer: Medicaid Other | Admitting: Physician Assistant

## 2020-04-16 ENCOUNTER — Telehealth: Payer: Self-pay | Admitting: Neurology

## 2020-04-16 ENCOUNTER — Other Ambulatory Visit: Payer: Self-pay

## 2020-04-16 VITALS — BP 128/65 | HR 72 | Ht 68.0 in | Wt 111.0 lb

## 2020-04-16 DIAGNOSIS — G44221 Chronic tension-type headache, intractable: Secondary | ICD-10-CM | POA: Diagnosis not present

## 2020-04-16 DIAGNOSIS — G479 Sleep disorder, unspecified: Secondary | ICD-10-CM

## 2020-04-16 DIAGNOSIS — Z1329 Encounter for screening for other suspected endocrine disorder: Secondary | ICD-10-CM

## 2020-04-16 DIAGNOSIS — IMO0002 Reserved for concepts with insufficient information to code with codable children: Secondary | ICD-10-CM

## 2020-04-16 DIAGNOSIS — E1043 Type 1 diabetes mellitus with diabetic autonomic (poly)neuropathy: Secondary | ICD-10-CM

## 2020-04-16 DIAGNOSIS — F411 Generalized anxiety disorder: Secondary | ICD-10-CM | POA: Diagnosis not present

## 2020-04-16 DIAGNOSIS — F331 Major depressive disorder, recurrent, moderate: Secondary | ICD-10-CM

## 2020-04-16 DIAGNOSIS — M5030 Other cervical disc degeneration, unspecified cervical region: Secondary | ICD-10-CM

## 2020-04-16 DIAGNOSIS — E1065 Type 1 diabetes mellitus with hyperglycemia: Secondary | ICD-10-CM | POA: Diagnosis not present

## 2020-04-16 DIAGNOSIS — Z1211 Encounter for screening for malignant neoplasm of colon: Secondary | ICD-10-CM

## 2020-04-16 DIAGNOSIS — E785 Hyperlipidemia, unspecified: Secondary | ICD-10-CM

## 2020-04-16 MED ORDER — PRAVASTATIN SODIUM 20 MG PO TABS: 20.0000 mg | ORAL_TABLET | Freq: Every day | ORAL | 0 refills | Status: DC

## 2020-04-16 MED ORDER — SERTRALINE HCL 100 MG PO TABS: ORAL_TABLET | ORAL | 1 refills | Status: DC

## 2020-04-16 MED ORDER — BACLOFEN 10 MG PO TABS: ORAL_TABLET | ORAL | 3 refills | Status: DC

## 2020-04-16 MED ORDER — TRAZODONE HCL 50 MG PO TABS: ORAL_TABLET | ORAL | 3 refills | Status: DC

## 2020-04-16 NOTE — Patient Instructions (Signed)
Will refer to headache specialist.

## 2020-04-16 NOTE — Telephone Encounter (Signed)
Cologuard order faxed to [phone] with confirmation received. They will contact the patient directly.   

## 2020-04-16 NOTE — Progress Notes (Signed)
Subjective:    Patient ID: Valerie Pratt, female    DOB: 1965-07-14, 55 y.o.   MRN: [id number]  HPI  Pt is a 55 yo female with COPD, chronic pancreatitis, T1DM, GAD, Chronic tension headache, dyslipidemia, insomnia who presents to the clinic for 6 month follow up.   Anxiety been a little worse lately. Not sure why. zoloft helped for many years. No SI/HC. No new life events.   Chronic tension headache persistent. Initially baclofen made 70 percent better then started to wane in efficacy. No sinus pressure. No fever, chills. She does not like to be touched or massages. No injury. We have tried numerous preventative headache medications with no improvement.   .. Active Ambulatory Problems    Diagnosis Date Noted  . History of pancreatitis 05/29/2015  . Constipation 05/29/2015  . Cough 05/29/2015  . Unintentional weight loss 05/29/2015  . Elevated random blood glucose level 05/29/2015  . Type I diabetes mellitus (HCC) 06/04/2015  . 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 Current smoker 08/26/2015  . Chronic obstructive pulmonary disease (HCC) 08/26/2015  . Depression 08/26/2015  . Former smoker 09/30/2015  . Sleeping difficulties 09/30/2015  . GAD (generalized anxiety disorder) 12/16/2015  . Breast cancer screening 01/13/2016  . Screening for lipid disorders 01/13/2016  . Colon cancer screening 01/13/2016  . Nuclear sclerotic cataract of both eyes 01/15/2016  . Chronic pancreatitis (HCC) 06/10/2017  . Dyslipidemia, goal LDL below 70 06/10/2017  . Traumatic wound dehiscence 08/27/2017  . Forearm laceration with complication, left, sequela 08/27/2017  . Cerebral venous sinus thrombosis 01/14/2018  . Ankle edema, bilateral 01/14/2018  . Closed skull fracture (HCC) 01/14/2018  . Syncope and collapse 01/14/2018  . Chronic post-traumatic headache, not intractable 01/14/2018  . Weight gain 01/14/2018  . Orthostatic hypotension  01/14/2018  . Memory changes 01/14/2018  . Intractable chronic post-traumatic headache 12/20/2018  . Abdominal wound dehiscence 02/07/2019  . Cellulitis of abdominal wall 02/07/2019  . Chronic tension-type headache, intractable 09/27/2019  . DDD (degenerative disc disease), cervical 09/27/2019  . Foraminal stenosis of cervical region 10/04/2019   Resolved Ambulatory Problems    Diagnosis Date Noted  . Excessive thirst 05/29/2015   Past Medical History:  Diagnosis Date  . Anxiety   . COPD (chronic obstructive pulmonary disease) (HCC)   . Diabetes mellitus without complication (HCC)   . Insomnia      Review of Systems     Objective:   Physical Exam   .. Depression screen [facility] 2/9 04/16/2020 12/14/2018 01/12/2018 01/12/2018 06/09/2017  Decreased Interest 1 1 1 1 2   Down, Depressed, Hopeless 2 1 - 1 3  PHQ - 2 Score 3 2 1 2 5   Altered sleeping 3 1 - - 3  Tired, decreased energy 1 1 - - 2  Change in appetite 1 0 - - 1  Feeling bad or failure about yourself  1 0 - 0 2  Trouble concentrating 1 1 - 1 1  Moving slowly or fidgety/restless 1 0 - 1 1  Suicidal thoughts 0 0 - 0 0  PHQ-9 Score 11 5 - - 15  Difficult doing work/chores Somewhat difficult Somewhat difficult - Somewhat difficult Somewhat difficult   . GAD 7 : Generalized Anxiety Score 04/16/2020 12/14/2018 01/12/2018 06/09/2017  Nervous, Anxious, on Edge 1 1 1 3   Control/stop worrying 1 1 1 3   Worry too much - different things 1 1 2 3   Trouble relaxing 1 1 2  2  Restless 1 1 2 2   Easily annoyed or irritable 1 0 0 2  Afraid - awful might happen 0 1 0 2  Total GAD 7 Score 6 6 8 17   Anxiety Difficulty Somewhat difficult Somewhat difficult Not difficult at all Very difficult         Assessment & Plan:   Maxx was seen today for follow-up.  Diagnoses and all orders for this visit:  Chronic tension-type headache, intractable -     baclofen (LIORESAL) 10 MG tablet; TAKE 1 TABLET BY MOUTH THREE TIMES DAILY -     DG  Cervical Spine Complete  DDD (degenerative disc disease), cervical -     baclofen (LIORESAL) 10 MG tablet; TAKE 1 TABLET BY MOUTH THREE TIMES DAILY  GAD (generalized anxiety disorder) -     sertraline (ZOLOFT) 100 MG tablet; Take one and one-half tablet daily.  Moderate episode of recurrent major depressive disorder (HCC) -     sertraline (ZOLOFT) 100 MG tablet; Take one and one-half tablet daily.  Hyperlipidemia LDL goal <70 -     pravastatin (PRAVACHOL) 20 MG tablet; Take 1 tablet (20 mg total) by mouth daily. labs for refills  Sleeping difficulties -     traZODone (DESYREL) 50 MG tablet; TAKE 1 TO 2 TABLETS BY MOUTH ONCE DAILY AT BEDTIME.  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   Ok to increase to 2 tablets of trazodone for sleep.   Needs cologuard if not going to do colonoscopy.   Anxiety is worsening. Increased zoloft to see if would help. Follow up in 6-8 weeks.   HA worsening again. Referral to HA clinic. Tried aimovig/emagality/nurtec/topamax/BB. Baclofen initially worked the best but then stopped working. I suspect more her cervial DDD. Pt does not like massages. No radicular pain. Use good supportive pillow. Consider chiropractor.

## 2020-04-23 ENCOUNTER — Other Ambulatory Visit: Payer: Self-pay | Admitting: Physician Assistant

## 2020-04-23 DIAGNOSIS — E785 Hyperlipidemia, unspecified: Secondary | ICD-10-CM

## 2020-04-26 DIAGNOSIS — Z7901 Long term (current) use of anticoagulants: Secondary | ICD-10-CM | POA: Diagnosis not present

## 2020-04-26 DIAGNOSIS — G08 Intracranial and intraspinal phlebitis and thrombophlebitis: Secondary | ICD-10-CM | POA: Diagnosis not present

## 2020-04-26 DIAGNOSIS — S066X1D Traumatic subarachnoid hemorrhage with loss of consciousness of 30 minutes or less, subsequent encounter: Secondary | ICD-10-CM | POA: Diagnosis not present

## 2020-04-29 ENCOUNTER — Encounter: Payer: Self-pay | Admitting: Physician Assistant

## 2020-05-01 ENCOUNTER — Other Ambulatory Visit: Payer: Self-pay | Admitting: Physician Assistant

## 2020-05-07 LAB — HM DIABETES EYE EXAM

## 2020-05-16 ENCOUNTER — Other Ambulatory Visit: Payer: Self-pay | Admitting: Physician Assistant

## 2020-05-21 DIAGNOSIS — G08 Intracranial and intraspinal phlebitis and thrombophlebitis: Secondary | ICD-10-CM | POA: Diagnosis not present

## 2020-05-21 DIAGNOSIS — S066X1D Traumatic subarachnoid hemorrhage with loss of consciousness of 30 minutes or less, subsequent encounter: Secondary | ICD-10-CM | POA: Diagnosis not present

## 2020-05-21 DIAGNOSIS — Z7901 Long term (current) use of anticoagulants: Secondary | ICD-10-CM | POA: Diagnosis not present

## 2020-05-31 DIAGNOSIS — Z7901 Long term (current) use of anticoagulants: Secondary | ICD-10-CM | POA: Diagnosis not present

## 2020-05-31 DIAGNOSIS — G08 Intracranial and intraspinal phlebitis and thrombophlebitis: Secondary | ICD-10-CM | POA: Diagnosis not present

## 2020-05-31 DIAGNOSIS — S066X1D Traumatic subarachnoid hemorrhage with loss of consciousness of 30 minutes or less, subsequent encounter: Secondary | ICD-10-CM | POA: Diagnosis not present

## 2020-06-02 IMAGING — DX DG FOREARM 2V*L*
2 series · 2 of 2 positions shown · non-contrast
Comparison: None.

CLINICAL DATA: Laceration 2 weeks ago with nonhealing wound,
initial encounter

EXAM:
LEFT FOREARM - 2 VIEW

[forearm ap]
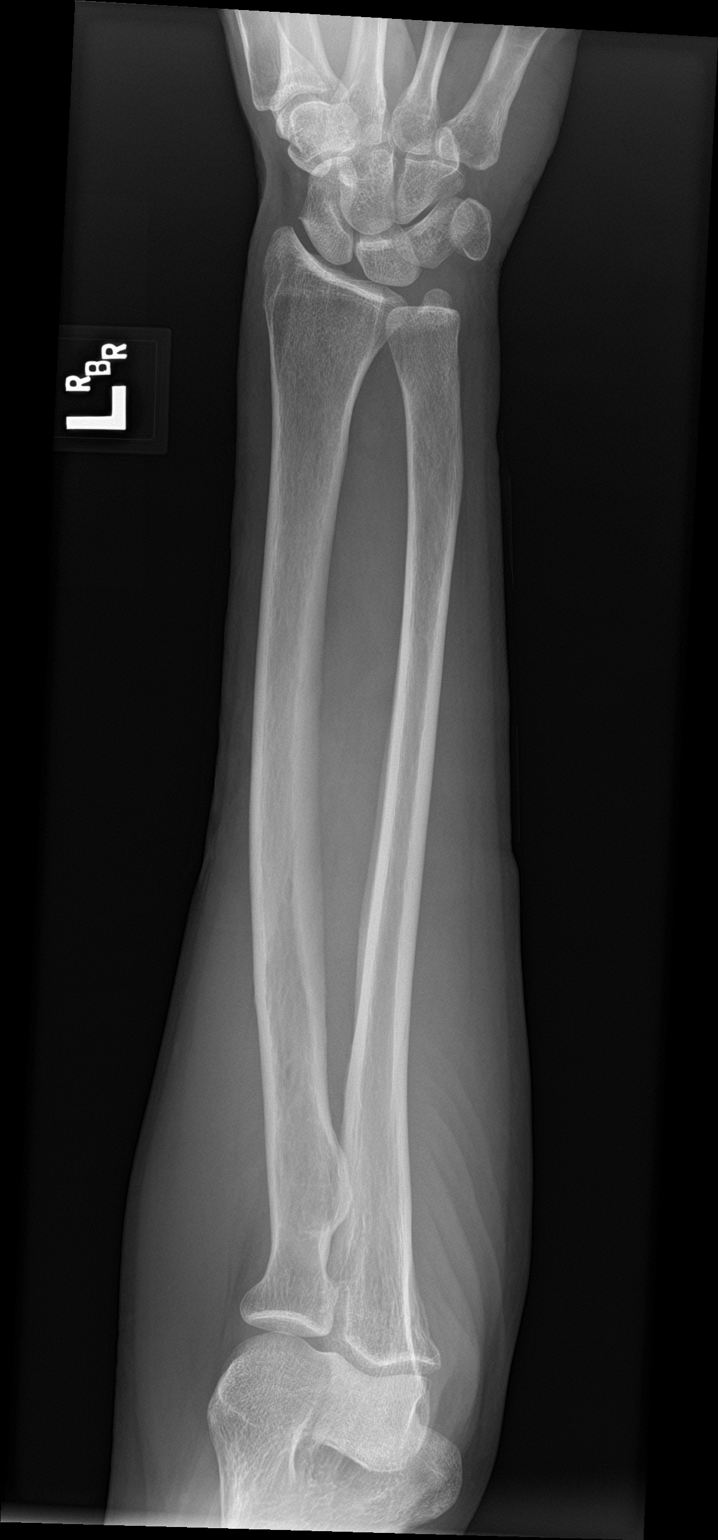

[forearm lat]
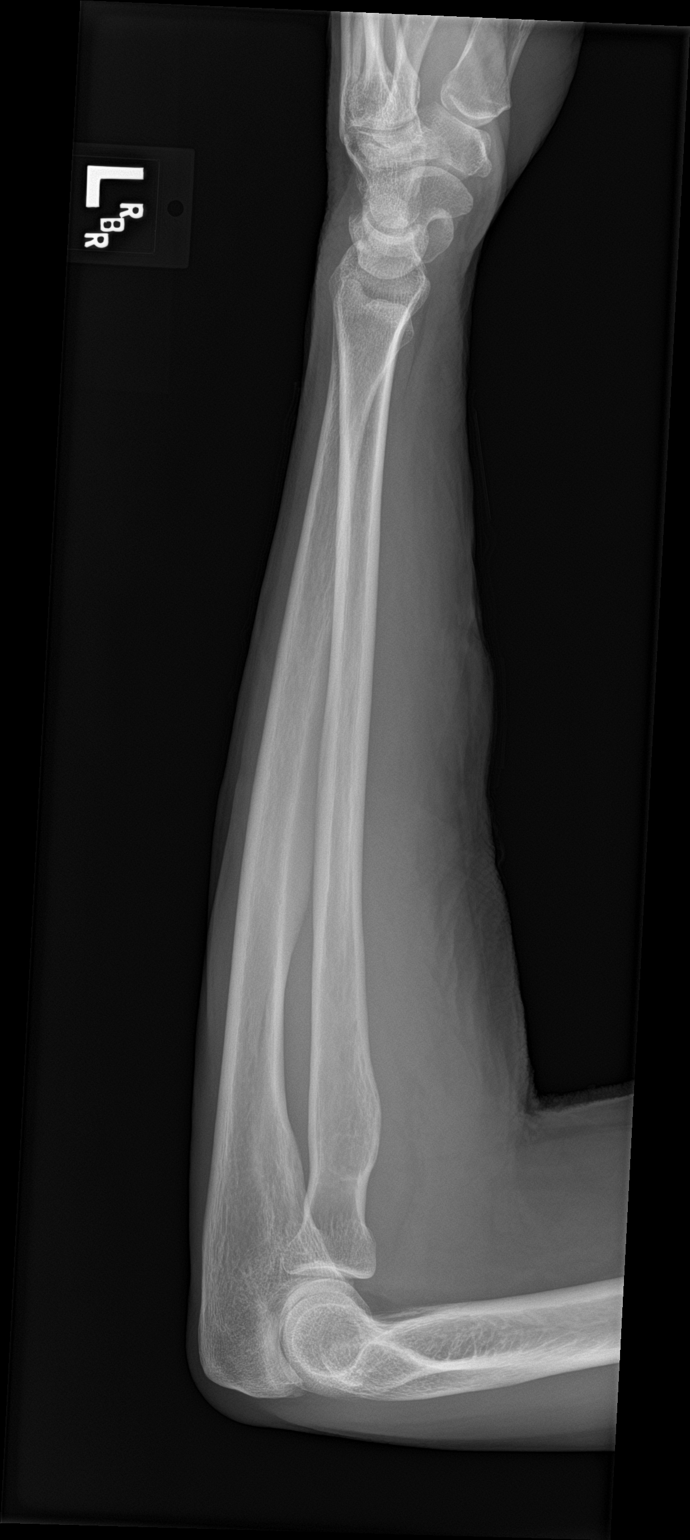

[2 of 2 positions shown; findings below may reference images not displayed]

FINDINGS: No acute bony abnormality is noted. Mild soft tissue irregularity is
noted consistent with the given clinical history.
IMPRESSION: No acute bony abnormality noted.

## 2020-06-11 ENCOUNTER — Encounter: Payer: Self-pay | Admitting: Physician Assistant

## 2020-06-24 ENCOUNTER — Other Ambulatory Visit: Payer: Self-pay | Admitting: Physician Assistant

## 2020-06-24 DIAGNOSIS — E785 Hyperlipidemia, unspecified: Secondary | ICD-10-CM

## 2020-06-25 DIAGNOSIS — Z7901 Long term (current) use of anticoagulants: Secondary | ICD-10-CM | POA: Diagnosis not present

## 2020-06-25 DIAGNOSIS — G08 Intracranial and intraspinal phlebitis and thrombophlebitis: Secondary | ICD-10-CM | POA: Diagnosis not present

## 2020-06-25 DIAGNOSIS — S066X1D Traumatic subarachnoid hemorrhage with loss of consciousness of 30 minutes or less, subsequent encounter: Secondary | ICD-10-CM | POA: Diagnosis not present

## 2020-07-23 DIAGNOSIS — S066X1D Traumatic subarachnoid hemorrhage with loss of consciousness of 30 minutes or less, subsequent encounter: Secondary | ICD-10-CM | POA: Diagnosis not present

## 2020-07-23 DIAGNOSIS — Z7901 Long term (current) use of anticoagulants: Secondary | ICD-10-CM | POA: Diagnosis not present

## 2020-07-23 DIAGNOSIS — G08 Intracranial and intraspinal phlebitis and thrombophlebitis: Secondary | ICD-10-CM | POA: Diagnosis not present

## 2020-07-25 ENCOUNTER — Other Ambulatory Visit: Payer: Self-pay | Admitting: Physician Assistant

## 2020-07-25 DIAGNOSIS — Z0000 Encounter for general adult medical examination without abnormal findings: Secondary | ICD-10-CM

## 2020-07-25 DIAGNOSIS — E785 Hyperlipidemia, unspecified: Secondary | ICD-10-CM

## 2020-07-25 DIAGNOSIS — IMO0002 Reserved for concepts with insufficient information to code with codable children: Secondary | ICD-10-CM

## 2020-07-25 DIAGNOSIS — E1043 Type 1 diabetes mellitus with diabetic autonomic (poly)neuropathy: Secondary | ICD-10-CM

## 2020-07-25 MED ORDER — PRAVASTATIN SODIUM 20 MG PO TABS: 20.0000 mg | ORAL_TABLET | Freq: Every day | ORAL | 0 refills | Status: DC

## 2020-08-05 DIAGNOSIS — E109 Type 1 diabetes mellitus without complications: Secondary | ICD-10-CM | POA: Diagnosis not present

## 2020-08-16 ENCOUNTER — Other Ambulatory Visit: Payer: Self-pay | Admitting: Physician Assistant

## 2020-08-27 DIAGNOSIS — S066X1D Traumatic subarachnoid hemorrhage with loss of consciousness of 30 minutes or less, subsequent encounter: Secondary | ICD-10-CM | POA: Diagnosis not present

## 2020-08-27 DIAGNOSIS — Z7901 Long term (current) use of anticoagulants: Secondary | ICD-10-CM | POA: Diagnosis not present

## 2020-08-27 DIAGNOSIS — G08 Intracranial and intraspinal phlebitis and thrombophlebitis: Secondary | ICD-10-CM | POA: Diagnosis not present

## 2020-09-18 ENCOUNTER — Other Ambulatory Visit: Payer: Self-pay | Admitting: Physician Assistant

## 2020-10-01 ENCOUNTER — Other Ambulatory Visit: Payer: Self-pay | Admitting: Physician Assistant

## 2020-10-01 DIAGNOSIS — G479 Sleep disorder, unspecified: Secondary | ICD-10-CM

## 2020-10-01 DIAGNOSIS — Z7901 Long term (current) use of anticoagulants: Secondary | ICD-10-CM | POA: Diagnosis not present

## 2020-10-01 DIAGNOSIS — G08 Intracranial and intraspinal phlebitis and thrombophlebitis: Secondary | ICD-10-CM | POA: Diagnosis not present

## 2020-10-01 DIAGNOSIS — G44221 Chronic tension-type headache, intractable: Secondary | ICD-10-CM

## 2020-10-01 DIAGNOSIS — S066X1D Traumatic subarachnoid hemorrhage with loss of consciousness of 30 minutes or less, subsequent encounter: Secondary | ICD-10-CM | POA: Diagnosis not present

## 2020-10-01 DIAGNOSIS — M5030 Other cervical disc degeneration, unspecified cervical region: Secondary | ICD-10-CM

## 2020-10-08 ENCOUNTER — Other Ambulatory Visit: Payer: Self-pay | Admitting: Physician Assistant

## 2020-10-08 DIAGNOSIS — G479 Sleep disorder, unspecified: Secondary | ICD-10-CM

## 2020-10-11 ENCOUNTER — Other Ambulatory Visit: Payer: Self-pay | Admitting: Neurology

## 2020-10-11 DIAGNOSIS — G479 Sleep disorder, unspecified: Secondary | ICD-10-CM

## 2020-10-11 MED ORDER — TRAZODONE HCL 50 MG PO TABS: ORAL_TABLET | ORAL | 1 refills | Status: DC

## 2020-10-21 DIAGNOSIS — Z7901 Long term (current) use of anticoagulants: Secondary | ICD-10-CM | POA: Diagnosis not present

## 2020-10-21 DIAGNOSIS — G08 Intracranial and intraspinal phlebitis and thrombophlebitis: Secondary | ICD-10-CM | POA: Diagnosis not present

## 2020-10-21 DIAGNOSIS — S066X1D Traumatic subarachnoid hemorrhage with loss of consciousness of 30 minutes or less, subsequent encounter: Secondary | ICD-10-CM | POA: Diagnosis not present

## 2020-11-06 ENCOUNTER — Other Ambulatory Visit: Payer: Self-pay | Admitting: Physician Assistant

## 2020-11-06 DIAGNOSIS — M5030 Other cervical disc degeneration, unspecified cervical region: Secondary | ICD-10-CM

## 2020-11-06 DIAGNOSIS — G44221 Chronic tension-type headache, intractable: Secondary | ICD-10-CM

## 2020-11-25 DIAGNOSIS — S066X1D Traumatic subarachnoid hemorrhage with loss of consciousness of 30 minutes or less, subsequent encounter: Secondary | ICD-10-CM | POA: Diagnosis not present

## 2020-11-25 DIAGNOSIS — Z7901 Long term (current) use of anticoagulants: Secondary | ICD-10-CM | POA: Diagnosis not present

## 2020-11-25 DIAGNOSIS — G08 Intracranial and intraspinal phlebitis and thrombophlebitis: Secondary | ICD-10-CM | POA: Diagnosis not present

## 2020-12-04 ENCOUNTER — Encounter: Payer: Self-pay | Admitting: Physician Assistant

## 2020-12-04 ENCOUNTER — Other Ambulatory Visit: Payer: Self-pay

## 2020-12-04 ENCOUNTER — Ambulatory Visit: Payer: Medicaid Other | Admitting: Physician Assistant

## 2020-12-04 VITALS — BP 113/69 | HR 71 | Temp 99.2°F | Ht 68.0 in | Wt 111.0 lb

## 2020-12-04 DIAGNOSIS — Z1159 Encounter for screening for other viral diseases: Secondary | ICD-10-CM

## 2020-12-04 DIAGNOSIS — G44221 Chronic tension-type headache, intractable: Secondary | ICD-10-CM

## 2020-12-04 DIAGNOSIS — Z1231 Encounter for screening mammogram for malignant neoplasm of breast: Secondary | ICD-10-CM

## 2020-12-04 DIAGNOSIS — F331 Major depressive disorder, recurrent, moderate: Secondary | ICD-10-CM | POA: Diagnosis not present

## 2020-12-04 DIAGNOSIS — M5030 Other cervical disc degeneration, unspecified cervical region: Secondary | ICD-10-CM

## 2020-12-04 DIAGNOSIS — Z114 Encounter for screening for human immunodeficiency virus [HIV]: Secondary | ICD-10-CM

## 2020-12-04 DIAGNOSIS — F411 Generalized anxiety disorder: Secondary | ICD-10-CM | POA: Diagnosis not present

## 2020-12-04 DIAGNOSIS — G479 Sleep disorder, unspecified: Secondary | ICD-10-CM | POA: Diagnosis not present

## 2020-12-04 DIAGNOSIS — Z23 Encounter for immunization: Secondary | ICD-10-CM | POA: Diagnosis not present

## 2020-12-04 DIAGNOSIS — E785 Hyperlipidemia, unspecified: Secondary | ICD-10-CM

## 2020-12-04 MED ORDER — BACLOFEN 10 MG PO TABS: ORAL_TABLET | ORAL | 1 refills | Status: DC

## 2020-12-04 MED ORDER — SERTRALINE HCL 100 MG PO TABS: ORAL_TABLET | ORAL | 1 refills | Status: DC

## 2020-12-04 MED ORDER — TRAZODONE HCL 50 MG PO TABS: ORAL_TABLET | ORAL | 1 refills | Status: DC

## 2020-12-04 NOTE — Progress Notes (Signed)
Subjective:    Patient ID: Valerie Pratt, female    DOB: 1965/03/12, 55 y.o.   MRN: [id number]  HPI Pt is a 55 yo female with T1DM, chronic tension headache, cervical DDD, GAD, HLD who presents to the clinic for follow up.   DM doing great and controlled by endocrinology.   Mood no concerns. Doing the best she has in a months.   Pt continues to have tension headache from her neck that radiate up and into her hold head daily. No dizziness, vision changes, pressure, memory issues, seizures. Baclofen helped at one point but not seming to help as much. She has tried many medications with no relief.   .. Active Ambulatory Problems    Diagnosis Date Noted   History of pancreatitis 05/29/2015   Constipation 05/29/2015   Cough 05/29/2015   Unintentional weight loss 05/29/2015   Elevated random blood glucose level 05/29/2015   Type I diabetes mellitus (HCC) 06/04/2015   Uncontrolled type 1 diabetes mellitus with diabetic autonomic neuropathy 06/28/2015   Current smoker 08/26/2015   Chronic obstructive pulmonary disease (HCC) 08/26/2015   Depression 08/26/2015   Former smoker 09/30/2015   Sleeping difficulties 09/30/2015   GAD (generalized anxiety disorder) 12/16/2015   Breast cancer screening 01/13/2016   Screening for lipid disorders 01/13/2016   Colon cancer screening 01/13/2016   Nuclear sclerotic cataract of both eyes 01/15/2016   Chronic pancreatitis (HCC) 06/10/2017   Dyslipidemia, goal LDL below 70 06/10/2017   Traumatic wound dehiscence 08/27/2017   Forearm laceration with complication, left, sequela 08/27/2017   Cerebral venous sinus thrombosis 01/14/2018   Ankle edema, bilateral 01/14/2018   Closed skull fracture (HCC) 01/14/2018   Syncope and collapse 01/14/2018   Chronic post-traumatic headache, not intractable 01/14/2018   Weight gain 01/14/2018   Orthostatic hypotension 01/14/2018   Memory changes 01/14/2018   Intractable chronic post-traumatic headache  12/20/2018   Abdominal wound dehiscence 02/07/2019   Cellulitis of abdominal wall 02/07/2019   Chronic tension-type headache, intractable 09/27/2019   DDD (degenerative disc disease), cervical 09/27/2019   Foraminal stenosis of cervical region 10/04/2019   Resolved Ambulatory Problems    Diagnosis Date Noted   Excessive thirst 05/29/2015   Past Medical History:  Diagnosis Date   Anxiety    COPD (chronic obstructive pulmonary disease) (HCC)    Diabetes mellitus without complication (HCC)    Insomnia       Review of Systems     Objective:   Physical Exam Vitals reviewed.  Constitutional:      Appearance: She is well-developed.  HENT:     Head: Normocephalic.  Eyes:     General: No scleral icterus.    Extraocular Movements: Extraocular movements intact.     Pupils: Pupils are equal, round, and reactive to light. Pupils are equal.  Cardiovascular:     Rate and Rhythm: Normal rate and regular rhythm.  Pulmonary:     Effort: Pulmonary effort is normal.  Musculoskeletal:     Cervical back: Normal range of motion and neck supple.  Neurological:     Mental Status: She is alert.  Psychiatric:        Mood and Affect: Mood normal.          Assessment & Plan:  Marland KitchenMarland KitchenAymar was seen today for depression, anxiety, hyperlipidemia, headache and insomnia.  Diagnoses and all orders for this visit:  Moderate episode of recurrent major depressive disorder (HCC) -     sertraline (ZOLOFT) 100 MG tablet; Take one and  one-half tablet daily. -     COMPLETE METABOLIC PANEL WITH GFR  GAD (generalized anxiety disorder) -     sertraline (ZOLOFT) 100 MG tablet; Take one and one-half tablet daily. -     COMPLETE METABOLIC PANEL WITH GFR  Hyperlipidemia LDL goal <70 -     Lipid Panel w/reflex Direct LDL  Sleeping difficulties -     traZODone (DESYREL) 50 MG tablet; TAKE 1 TO 2 TABLETS BY MOUTH ONCE DAILY AT BEDTIME. -     TSH  Chronic tension-type headache, intractable -      baclofen (LIORESAL) 10 MG tablet; TAKE 1 TABLET BY MOUTH THREE TIMES DAILY  Encounter for screening mammogram for malignant neoplasm of breast -     Cancel: MM DIGITAL SCREENING BILATERAL; Future -     MM 3D SCREEN BREAST BILATERAL; Future  Need for influenza vaccination -     Flu Vaccine QUAD 53mo+IM (Fluarix, Fluzone & Alfiuria Quad PF)  Need for pneumococcal vaccination -     Pneumococcal conjugate vaccine 20-valent  Screening for HIV (human immunodeficiency virus) -     HIV Antibody (routine testing w rflx)  Need for hepatitis C screening test -     Hepatitis C antibody  DDD (degenerative disc disease), cervical -     baclofen (LIORESAL) 10 MG tablet; TAKE 1 TABLET BY MOUTH THREE TIMES DAILY  Type I managed by endocrinology. Last A1C looked great.  On STATIN.  BP to goal.  Fasting labs ordered.   Refilled baclofen which helps some with tension headache. No imaging has been done. Ordered CT.  Pt has tried [place], aimovig, muscle relaxers, imitrex with no relief.   Mood controlled.  No concerns with PHQ and GAD.  Refilled zoloft.   Trazodone for sleep sent.   Pt declines covid vaccine, shingles vaccine, pap smear, cologuard.    Flu shot was given.

## 2020-12-04 NOTE — Patient Instructions (Addendum)
Tension Headache, Adult A tension headache is a feeling of pain, pressure, or aching over the front and sides of the head. The pain can be dull, or it can feel tight. There are two types of tension headache: Episodic tension headache. This is when the headaches happen fewer than 15 days a month. Chronic tension headache. This is when the headaches happen more than 15 days a month during a 3-month period. A tension headache can last from 30 minutes to several days. It is the most common kind of headache. Tension headaches are not normally associated with nausea or vomiting, and they do not get worse with physical activity. What are the causes? The exact cause of this condition is not known. Tension headaches are often triggered by stress, anxiety, or depression. Other triggers may include: Alcohol. Too much caffeine or caffeine withdrawal. Respiratory infections, such as colds, flu, or sinus infections. Dental problems or teeth clenching. Fatigue. Holding your head and neck in the same position for a long period of time, such as while using a computer. Smoking. Arthritis of the neck. What are the signs or symptoms? Symptoms of this condition include: A feeling of pressure or tightness around the head. Dull, aching head pain. Pain over the front and sides of the head. Tenderness in the muscles of the head, neck, and shoulders. How is this diagnosed? This condition may be diagnosed based on your symptoms, your medical history, and a physical exam. If your symptoms are severe or unusual, you may have imaging tests, such as a CT scan or an MRI of your head. Your vision may also be checked. How is this treated? This condition may be treated with lifestyle changes and with medicines that help relieve symptoms. Follow these instructions at home: Managing pain Take over-the-counter and prescription medicines only as told by your health care provider. When you have a headache, lie down in a dark,  quiet room. If directed, put ice on your head and neck. To do this: Put ice in a plastic bag. Place a towel between your skin and the bag. Leave the ice on for 20 minutes, 2-3 times a day. Remove the ice if your skin turns bright red. This is very important. If you cannot feel pain, heat, or cold, you have a greater risk of damage to the area. If directed, apply heat to the back of your neck as often as told by your health care provider. Use the heat source that your health care provider recommends, such as a moist heat pack or a heating pad. Place a towel between your skin and the heat source. Leave the heat on for 20-30 minutes. Remove the heat if your skin turns bright red. This is especially important if you are unable to feel pain, heat, or cold. You have a greater risk of getting burned. Eating and drinking Eat meals on a regular schedule. If you drink alcohol: Limit how much you have to: 0-1 drink a day for women who are not pregnant. 0-2 drinks a day for men. Know how much alcohol is in your drink. In the [place] one drink equals one 12 oz bottle of beer (355 mL), one 5 oz glass of wine (148 mL), or one 1 oz glass of hard liquor (44 mL). Drink enough fluid to keep your urine pale yellow. Decrease your caffeine intake, or stop using caffeine. Lifestyle Get 7-9 hours of sleep each night, or get the amount of sleep recommended by your health care provider. At bedtime,   remove computers, phones, and tablets from your room. Find ways to manage your stress. This may include: Exercise. Deep breathing exercises. Yoga. Listening to music. Positive mental imagery. Try to sit up straight and avoid tensing your muscles. Do not use any products that contain nicotine or tobacco. These include cigarettes, chewing tobacco, and vaping devices, such as e-cigarettes. If you need help quitting, ask your health care provider. General instructions  Avoid any headache triggers. Keep a journal to help  find out what may trigger your headaches. For example, write down: What you eat and drink. How much sleep you get. Any change to your diet or medicines. Keep all follow-up visits. This is important. Contact a health care provider if: Your headache does not get better. Your headache comes back. You are sensitive to sounds, light, or smells because of a headache. You have nausea or you vomit. Your stomach hurts. Get help right away if: You suddenly develop a severe headache, along with any of the following: A stiff neck. Nausea and vomiting. Confusion. Weakness in one part or one side of your body. Double vision or loss of vision. Shortness of breath. Rash. Unusual sleepiness. Fever or chills. Trouble speaking. Pain in your eye or ear. Trouble walking or balancing. Feeling faint or passing out. Summary A tension headache is a feeling of pain, pressure, or aching over the front and sides of the head. A tension headache can last from 30 minutes to several days. It is the most common kind of headache. This condition may be diagnosed based on your symptoms, your medical history, and a physical exam. This condition may be treated with lifestyle changes and with medicines that help relieve symptoms. This information is not intended to replace advice given to you by your health care provider. Make sure you discuss any questions you have with your health care provider. Document Revised: 09/21/2019 Document Reviewed: 09/21/2019 Elsevier Patient Education  2022 Elsevier Inc.   Influenza (Flu) Vaccine (Inactivated or Recombinant): What You Need to Know 1. Why get vaccinated? Influenza vaccine can prevent influenza (flu). Flu is a contagious disease that spreads around the [place] every year, usually between October and May. Anyone can get the flu, but it is more dangerous for some people. Infants and young children, people 69 years and older, pregnant people, and people with certain  health conditions or a weakened immune system are at greatest risk of flu complications. Pneumonia, bronchitis, sinus infections, and ear infections are examples of flu-related complications. If you have a medical condition, such as heart disease, cancer, or diabetes, flu can make it worse. Flu can cause fever and chills, sore throat, muscle aches, fatigue, cough, headache, and runny or stuffy nose. Some people may have vomiting and diarrhea, though this is more common in children than adults. In an average year, thousands of people in the [place] die from flu, and many more are hospitalized. Flu vaccine prevents millions of illnesses and flu-related visits to the doctor each year. 2. Influenza vaccines CDC recommends everyone 6 months and older get vaccinated every flu season. Children 6 months through 34 years of age may need 2 doses during a single flu season. Everyone else needs only 1 dose each flu season. It takes about 2 weeks for protection to develop after vaccination. There are many flu viruses, and they are always changing. Each year a new flu vaccine is made to protect against the influenza viruses believed to be likely to cause disease in the upcoming flu  season. Even when the vaccine doesn't exactly match these viruses, it may still provide some protection. Influenza vaccine does not cause flu. Influenza vaccine may be given at the same time as other vaccines. 3. Talk with your health care provider Tell your vaccination provider if the person getting the vaccine: Has had an allergic reaction after a previous dose of influenza vaccine, or has any severe, life-threatening allergies Has ever had Guillain-Barr Syndrome (also called "GBS") In some cases, your health care provider may decide to postpone influenza vaccination until a future visit. Influenza vaccine can be administered at any time during pregnancy. People who are or will be pregnant during influenza season should receive  inactivated influenza vaccine. People with minor illnesses, such as a cold, may be vaccinated. People who are moderately or severely ill should usually wait until they recover before getting influenza vaccine. Your health care provider can give you more information. 4. Risks of a vaccine reaction Soreness, redness, and swelling where the shot is given, fever, muscle aches, and headache can happen after influenza vaccination. There may be a very small increased risk of Guillain-Barr Syndrome (GBS) after inactivated influenza vaccine (the flu shot). Young children who get the flu shot along with pneumococcal vaccine (PCV13) and/or DTaP vaccine at the same time might be slightly more likely to have a seizure caused by fever. Tell your health care provider if a child who is getting flu vaccine has ever had a seizure. People sometimes faint after medical procedures, including vaccination. Tell your provider if you feel dizzy or have vision changes or ringing in the ears. As with any medicine, there is a very remote chance of a vaccine causing a severe allergic reaction, other serious injury, or death. 5. What if there is a serious problem? An allergic reaction could occur after the vaccinated person leaves the clinic. If you see signs of a severe allergic reaction (hives, swelling of the face and throat, difficulty breathing, a fast heartbeat, dizziness, or weakness), call 9-1-1 and get the person to the nearest hospital. For other signs that concern you, call your health care provider. Adverse reactions should be reported to the Vaccine Adverse Event Reporting System (VAERS). Your health care provider will usually file this report, or you can do it yourself. Visit the VAERS website at www.vaers.LAgents.no or call 402-512-0122. VAERS is only for reporting reactions, and VAERS staff members do not give medical advice. 6. The National Vaccine Injury Compensation Program The [employer] Vaccine Injury  Compensation Program (VICP) is a federal program that was created to compensate people who may have been injured by certain vaccines. Claims regarding alleged injury or death due to vaccination have a time limit for filing, which may be as short as two years. Visit the VICP website at SpiritualWord.at or call 231-545-4576 to learn about the program and about filing a claim. 7. How can I learn more? Ask your health care provider. Call your local or state health department. Visit the website of the Food and Drug Administration (FDA) for vaccine package inserts and additional information at FinderList.no. Contact the Centers for Disease Control and Prevention (CDC): Call 337-387-8322 (1-800-CDC-INFO) or Visit CDC's website at BiotechRoom.com.cy. Vaccine Information Statement Inactivated Influenza Vaccine (08/11/2019) This information is not intended to replace advice given to you by your health care provider. Make sure you discuss any questions you have with your health care provider. Document Revised: 09/12/2020 Document Reviewed: 09/12/2020 Elsevier Patient Education  2022 Elsevier Inc.   Pneumococcal Conjugate Vaccine (Prevnar 20)  Suspension for Injection What is this medication? PNEUMOCOCCAL VACCINE (NEU mo KOK al vak SEEN) is a vaccine. It prevents pneumococcus bacterial infections. These bacteria can cause serious infections like pneumonia, meningitis, and blood infections. This vaccine will not treat an infection and will not cause infection. This vaccine is recommended for adults 18 years and older. This medicine may be used for other purposes; ask your health care provider or pharmacist if you have questions. COMMON BRAND NAME(S): Prevnar 20 What should I tell my care team before I take this medication? They need to know if you have any of these conditions: bleeding disorder fever immune system problems an unusual or allergic reaction to  pneumococcal vaccine, diphtheria toxoid, other vaccines, other medicines, foods, dyes, or preservatives pregnant or trying to get pregnant breast-feeding How should I use this medication? This vaccine is injected into a muscle. It is given by a health care provider. A copy of Vaccine Information Statements will be given before each vaccination. Be sure to read this information carefully each time. This sheet may change often. Talk to your health care provider about the use of this medicine in children. Special care may be needed. Overdosage: If you think you have taken too much of this medicine contact a poison control center or emergency room at once. NOTE: This medicine is only for you. Do not share this medicine with others. What if I miss a dose? This does not apply. This medicine is not for regular use. What may interact with this medication? medicines for cancer chemotherapy medicines that suppress your immune function steroid medicines like prednisone or cortisone This list may not describe all possible interactions. Give your health care provider a list of all the medicines, herbs, non-prescription drugs, or dietary supplements you use. Also tell them if you smoke, drink alcohol, or use illegal drugs. Some items may interact with your medicine. What should I watch for while using this medication? Mild fever and pain should go away in 3 days or less. Report any unusual symptoms to your health care provider. What side effects may I notice from receiving this medication? Side effects that you should report to your doctor or health care professional as soon as possible: allergic reactions (skin rash, itching or hives; swelling of the face, lips, or tongue) confusion fast, irregular heartbeat fever over 102 degrees F muscle weakness seizures trouble breathing unusual bruising or bleeding Side effects that usually do not require medical attention (report to your doctor or health care  professional if they continue or are bothersome): fever of 102 degrees F or less headache joint pain muscle cramps, pain pain, tender at site where injected This list may not describe all possible side effects. Call your doctor for medical advice about side effects. You may report side effects to FDA at 1-800-FDA-1088. Where should I keep my medication? This vaccine is only given by a health care provider. It will not be stored at home. NOTE: This sheet is a summary. It may not cover all possible information. If you have questions about this medicine, talk to your doctor, pharmacist, or health care provider.  2022 Elsevier/Gold Standard (2019-09-07 00:00:00)

## 2020-12-05 ENCOUNTER — Other Ambulatory Visit: Payer: Self-pay | Admitting: Physician Assistant

## 2020-12-05 DIAGNOSIS — Z0000 Encounter for general adult medical examination without abnormal findings: Secondary | ICD-10-CM

## 2020-12-05 DIAGNOSIS — E785 Hyperlipidemia, unspecified: Secondary | ICD-10-CM

## 2020-12-05 LAB — COMPLETE METABOLIC PANEL WITH GFR
AG Ratio: 1.8 (calc) (ref 1.0–2.5)
ALT: 19 U/L (ref 6–29)
AST: 23 U/L (ref 10–35)
Albumin: 4.5 g/dL (ref 3.6–5.1)
Alkaline phosphatase (APISO): 72 U/L (ref 37–153)
BUN: 15 mg/dL (ref 7–25)
CO2: 28 mmol/L (ref 20–32)
Calcium: 9.6 mg/dL (ref 8.6–10.4)
Chloride: 104 mmol/L (ref 98–110)
Creat: 0.6 mg/dL (ref 0.50–1.03)
Globulin: 2.5 g/dL (calc) (ref 1.9–3.7)
Glucose, Bld: 114 mg/dL — ABNORMAL HIGH (ref 65–99)
Potassium: 4.7 mmol/L (ref 3.5–5.3)
Sodium: 143 mmol/L (ref 135–146)
Total Bilirubin: 0.6 mg/dL (ref 0.2–1.2)
Total Protein: 7 g/dL (ref 6.1–8.1)
eGFR: 105 mL/min/{1.73_m2} (ref >=60)

## 2020-12-05 LAB — TSH: TSH: 0.72 m[IU]/L (ref 0.40–4.50)

## 2020-12-05 LAB — HIV ANTIBODY (ROUTINE TESTING W REFLEX): HIV 1&2 Ab, 4th Generation: NONREACTIVE

## 2020-12-05 LAB — LIPID PANEL W/REFLEX DIRECT LDL
Cholesterol: 159 mg/dL (ref <200)
HDL: 80 mg/dL (ref >=50)
LDL Cholesterol (Calc): 66 mg/dL (calc)
Non-HDL Cholesterol (Calc): 79 mg/dL (calc) (ref <130)
Total CHOL/HDL Ratio: 2 (calc) (ref <5.0)
Triglycerides: 54 mg/dL (ref <150)

## 2020-12-05 LAB — HEPATITIS C ANTIBODY
Hepatitis C Ab: NONREACTIVE
SIGNAL TO CUT-OFF: 0.03 (ref <1.00)

## 2020-12-06 NOTE — Progress Notes (Signed)
Cholesterol looks amazing.  Thyroid stable and great.  Kidney and liver look great.

## 2020-12-17 ENCOUNTER — Ambulatory Visit: Payer: Medicaid Other

## 2020-12-17 ENCOUNTER — Other Ambulatory Visit: Payer: Self-pay

## 2020-12-27 ENCOUNTER — Other Ambulatory Visit: Payer: Self-pay | Admitting: Neurology

## 2020-12-27 MED ORDER — ALBUTEROL SULFATE HFA 108 (90 BASE) MCG/ACT IN AERS: INHALATION_SPRAY | RESPIRATORY_TRACT | 1 refills | Status: DC

## 2020-12-27 NOTE — Progress Notes (Signed)
Per [facility] ProAir not available. Checked with patient,no issues with Albuterol that she can remember. Generic sent.

## 2020-12-31 ENCOUNTER — Other Ambulatory Visit: Payer: Medicaid Other

## 2021-01-02 ENCOUNTER — Other Ambulatory Visit: Payer: Self-pay

## 2021-01-02 ENCOUNTER — Ambulatory Visit (INDEPENDENT_AMBULATORY_CARE_PROVIDER_SITE_OTHER): Payer: Medicaid Other

## 2021-01-02 DIAGNOSIS — G44221 Chronic tension-type headache, intractable: Secondary | ICD-10-CM | POA: Diagnosis not present

## 2021-01-03 NOTE — Progress Notes (Signed)
Normal CT scan of head

## 2021-01-07 DIAGNOSIS — Z7901 Long term (current) use of anticoagulants: Secondary | ICD-10-CM | POA: Diagnosis not present

## 2021-01-07 DIAGNOSIS — S066X1D Traumatic subarachnoid hemorrhage with loss of consciousness of 30 minutes or less, subsequent encounter: Secondary | ICD-10-CM | POA: Diagnosis not present

## 2021-01-07 DIAGNOSIS — G08 Intracranial and intraspinal phlebitis and thrombophlebitis: Secondary | ICD-10-CM | POA: Diagnosis not present

## 2021-01-21 DIAGNOSIS — S066X1D Traumatic subarachnoid hemorrhage with loss of consciousness of 30 minutes or less, subsequent encounter: Secondary | ICD-10-CM | POA: Diagnosis not present

## 2021-01-21 DIAGNOSIS — Z7901 Long term (current) use of anticoagulants: Secondary | ICD-10-CM | POA: Diagnosis not present

## 2021-01-21 DIAGNOSIS — G08 Intracranial and intraspinal phlebitis and thrombophlebitis: Secondary | ICD-10-CM | POA: Diagnosis not present

## 2021-02-04 DIAGNOSIS — E109 Type 1 diabetes mellitus without complications: Secondary | ICD-10-CM | POA: Diagnosis not present

## 2021-02-04 LAB — HEMOGLOBIN A1C: Hemoglobin A1C: 6.5

## 2021-02-05 DIAGNOSIS — E109 Type 1 diabetes mellitus without complications: Secondary | ICD-10-CM | POA: Diagnosis not present

## 2021-02-18 DIAGNOSIS — S066X1D Traumatic subarachnoid hemorrhage with loss of consciousness of 30 minutes or less, subsequent encounter: Secondary | ICD-10-CM | POA: Diagnosis not present

## 2021-02-18 DIAGNOSIS — Z7901 Long term (current) use of anticoagulants: Secondary | ICD-10-CM | POA: Diagnosis not present

## 2021-02-18 DIAGNOSIS — G08 Intracranial and intraspinal phlebitis and thrombophlebitis: Secondary | ICD-10-CM | POA: Diagnosis not present

## 2021-03-04 DIAGNOSIS — G08 Intracranial and intraspinal phlebitis and thrombophlebitis: Secondary | ICD-10-CM | POA: Diagnosis not present

## 2021-03-04 DIAGNOSIS — Z7901 Long term (current) use of anticoagulants: Secondary | ICD-10-CM | POA: Diagnosis not present

## 2021-03-04 DIAGNOSIS — S066X1D Traumatic subarachnoid hemorrhage with loss of consciousness of 30 minutes or less, subsequent encounter: Secondary | ICD-10-CM | POA: Diagnosis not present

## 2021-03-15 ENCOUNTER — Other Ambulatory Visit: Payer: Self-pay | Admitting: Physician Assistant

## 2021-03-21 ENCOUNTER — Other Ambulatory Visit: Payer: Self-pay | Admitting: Physician Assistant

## 2021-03-21 DIAGNOSIS — M5030 Other cervical disc degeneration, unspecified cervical region: Secondary | ICD-10-CM

## 2021-03-21 DIAGNOSIS — G44221 Chronic tension-type headache, intractable: Secondary | ICD-10-CM

## 2021-03-21 DIAGNOSIS — G08 Intracranial and intraspinal phlebitis and thrombophlebitis: Secondary | ICD-10-CM | POA: Diagnosis not present

## 2021-03-21 DIAGNOSIS — Z7901 Long term (current) use of anticoagulants: Secondary | ICD-10-CM | POA: Diagnosis not present

## 2021-03-21 DIAGNOSIS — S066X1D Traumatic subarachnoid hemorrhage with loss of consciousness of 30 minutes or less, subsequent encounter: Secondary | ICD-10-CM | POA: Diagnosis not present

## 2021-04-05 ENCOUNTER — Other Ambulatory Visit: Payer: Self-pay | Admitting: Physician Assistant

## 2021-04-05 DIAGNOSIS — G479 Sleep disorder, unspecified: Secondary | ICD-10-CM

## 2021-04-18 ENCOUNTER — Encounter: Payer: Self-pay | Admitting: Physician Assistant

## 2021-04-18 DIAGNOSIS — S066X1D Traumatic subarachnoid hemorrhage with loss of consciousness of 30 minutes or less, subsequent encounter: Secondary | ICD-10-CM | POA: Diagnosis not present

## 2021-04-18 DIAGNOSIS — Z7901 Long term (current) use of anticoagulants: Secondary | ICD-10-CM | POA: Diagnosis not present

## 2021-04-18 DIAGNOSIS — G08 Intracranial and intraspinal phlebitis and thrombophlebitis: Secondary | ICD-10-CM | POA: Diagnosis not present

## 2021-04-23 ENCOUNTER — Other Ambulatory Visit: Payer: Self-pay | Admitting: Physician Assistant

## 2021-04-28 ENCOUNTER — Ambulatory Visit: Payer: Medicaid Other | Admitting: Physician Assistant

## 2021-04-28 VITALS — BP 125/65 | HR 72 | Ht 68.0 in | Wt 106.2 lb

## 2021-04-28 DIAGNOSIS — Z7901 Long term (current) use of anticoagulants: Secondary | ICD-10-CM

## 2021-04-28 LAB — POCT INR: INR: 3.2 — AB (ref 2.0–3.0)

## 2021-04-28 NOTE — Progress Notes (Signed)
INR goal 2-3.  ?Increased coumadin to 7.5mg  three days a week M, W, F.  ?And 1/2 tablet all other days ?Recheck in 2 weeks.  ?

## 2021-04-28 NOTE — Progress Notes (Signed)
Pt presented for INR check. No missed doses.  ?Schedule: M & F = 3.75; All other days = 7.5 ? ?Per [place], new schedule: ?M, W, F = 3.75 ?T, TH, S, SU = 7.5 ? ?Recheck INR in 14 days. ?

## 2021-05-12 ENCOUNTER — Ambulatory Visit: Payer: Medicaid Other | Admitting: Physician Assistant

## 2021-05-12 VITALS — BP 97/63 | HR 70

## 2021-05-12 DIAGNOSIS — Z7901 Long term (current) use of anticoagulants: Secondary | ICD-10-CM | POA: Diagnosis not present

## 2021-05-12 LAB — POCT INR: INR: 2.4 (ref 2.0–3.0)

## 2021-05-12 NOTE — Progress Notes (Signed)
No change. Follow up in 4 weeks.  ?

## 2021-05-12 NOTE — Patient Instructions (Signed)
No change. Follow up in 4 weeks.  ?

## 2021-05-20 ENCOUNTER — Encounter: Payer: Self-pay | Admitting: Physician Assistant

## 2021-05-21 MED ORDER — MAGIC MOUTHWASH W/LIDOCAINE: 15.0000 mL | Freq: Four times a day (QID) | ORAL | 1 refills | Status: DC | PRN

## 2021-05-22 ENCOUNTER — Other Ambulatory Visit: Payer: Self-pay | Admitting: Physician Assistant

## 2021-05-22 DIAGNOSIS — M5030 Other cervical disc degeneration, unspecified cervical region: Secondary | ICD-10-CM

## 2021-05-22 DIAGNOSIS — G44221 Chronic tension-type headache, intractable: Secondary | ICD-10-CM

## 2021-05-22 DIAGNOSIS — G479 Sleep disorder, unspecified: Secondary | ICD-10-CM

## 2021-05-26 ENCOUNTER — Other Ambulatory Visit: Payer: Self-pay | Admitting: Physician Assistant

## 2021-05-26 ENCOUNTER — Ambulatory Visit: Payer: Medicaid Other | Admitting: Physician Assistant

## 2021-05-26 ENCOUNTER — Encounter: Payer: Self-pay | Admitting: Physician Assistant

## 2021-05-26 VITALS — BP 97/59 | HR 66

## 2021-05-26 DIAGNOSIS — G44221 Chronic tension-type headache, intractable: Secondary | ICD-10-CM

## 2021-05-26 DIAGNOSIS — Z7901 Long term (current) use of anticoagulants: Secondary | ICD-10-CM

## 2021-05-26 DIAGNOSIS — G479 Sleep disorder, unspecified: Secondary | ICD-10-CM

## 2021-05-26 DIAGNOSIS — M5030 Other cervical disc degeneration, unspecified cervical region: Secondary | ICD-10-CM

## 2021-05-26 LAB — POCT INR: INR: 2.6 (ref 2.0–3.0)

## 2021-05-26 NOTE — Progress Notes (Signed)
Same dose. Follow up in 4 weeks.  

## 2021-05-26 NOTE — Progress Notes (Signed)
Pt here for INR check.  Results entered in chart.  Tiajuana Amass, CMA

## 2021-05-29 ENCOUNTER — Other Ambulatory Visit: Payer: Self-pay | Admitting: Physician Assistant

## 2021-05-29 DIAGNOSIS — M5030 Other cervical disc degeneration, unspecified cervical region: Secondary | ICD-10-CM

## 2021-05-29 DIAGNOSIS — G44221 Chronic tension-type headache, intractable: Secondary | ICD-10-CM

## 2021-05-29 DIAGNOSIS — G479 Sleep disorder, unspecified: Secondary | ICD-10-CM

## 2021-06-02 ENCOUNTER — Encounter: Payer: Self-pay | Admitting: Physician Assistant

## 2021-06-02 DIAGNOSIS — G479 Sleep disorder, unspecified: Secondary | ICD-10-CM

## 2021-06-02 DIAGNOSIS — M5030 Other cervical disc degeneration, unspecified cervical region: Secondary | ICD-10-CM

## 2021-06-02 DIAGNOSIS — G44221 Chronic tension-type headache, intractable: Secondary | ICD-10-CM

## 2021-06-03 ENCOUNTER — Telehealth: Payer: Self-pay | Admitting: Family Medicine

## 2021-06-03 MED ORDER — BACLOFEN 10 MG PO TABS: 10.0000 mg | ORAL_TABLET | Freq: Three times a day (TID) | ORAL | 0 refills | Status: DC

## 2021-06-03 MED ORDER — TRAZODONE HCL 50 MG PO TABS: ORAL_TABLET | ORAL | 0 refills | Status: DC

## 2021-06-03 NOTE — Telephone Encounter (Signed)
Called patient and made her aware. She states they are better and not bothering her. Will let us know if anything changes.

## 2021-06-03 NOTE — Telephone Encounter (Signed)
Please call patient and let her know that we were unable to get the Magic mouthwash that we sent over the pharmacy notified us that there is a backorder on some of the ingredients.  Hopefully her canker sores feeling better and she does not need additional treatment.

## 2021-06-14 ENCOUNTER — Other Ambulatory Visit: Payer: Self-pay | Admitting: Physician Assistant

## 2021-06-23 ENCOUNTER — Ambulatory Visit: Payer: Medicaid Other | Admitting: Physician Assistant

## 2021-06-23 VITALS — BP 107/66 | HR 69 | Resp 20 | Ht 68.0 in | Wt 105.1 lb

## 2021-06-23 DIAGNOSIS — Z7901 Long term (current) use of anticoagulants: Secondary | ICD-10-CM | POA: Diagnosis not present

## 2021-06-23 LAB — POCT INR: INR: 2.7 (ref 2.0–3.0)

## 2021-06-23 NOTE — Progress Notes (Addendum)
Stay on same dose. Recheck nurse visit in one month.

## 2021-06-24 ENCOUNTER — Other Ambulatory Visit: Payer: Self-pay | Admitting: Family Medicine

## 2021-06-24 DIAGNOSIS — G44221 Chronic tension-type headache, intractable: Secondary | ICD-10-CM

## 2021-06-24 DIAGNOSIS — M5030 Other cervical disc degeneration, unspecified cervical region: Secondary | ICD-10-CM

## 2021-07-05 ENCOUNTER — Other Ambulatory Visit: Payer: Self-pay | Admitting: Physician Assistant

## 2021-07-08 ENCOUNTER — Other Ambulatory Visit: Payer: Self-pay | Admitting: Family Medicine

## 2021-07-08 DIAGNOSIS — G479 Sleep disorder, unspecified: Secondary | ICD-10-CM

## 2021-07-09 ENCOUNTER — Encounter: Payer: Self-pay | Admitting: Family Medicine

## 2021-07-18 ENCOUNTER — Other Ambulatory Visit: Payer: Self-pay | Admitting: Physician Assistant

## 2021-07-18 DIAGNOSIS — F411 Generalized anxiety disorder: Secondary | ICD-10-CM

## 2021-07-18 DIAGNOSIS — F331 Major depressive disorder, recurrent, moderate: Secondary | ICD-10-CM

## 2021-07-21 ENCOUNTER — Ambulatory Visit (INDEPENDENT_AMBULATORY_CARE_PROVIDER_SITE_OTHER): Payer: Medicaid Other | Admitting: Physician Assistant

## 2021-07-21 VITALS — BP 105/66 | HR 63

## 2021-07-21 DIAGNOSIS — Z7901 Long term (current) use of anticoagulants: Secondary | ICD-10-CM

## 2021-07-21 LAB — POCT INR: INR: 2.2 (ref 2.0–3.0)

## 2021-07-21 NOTE — Progress Notes (Signed)
Same dose. Follow up in 1 month.

## 2021-08-01 ENCOUNTER — Other Ambulatory Visit: Payer: Self-pay | Admitting: Physician Assistant

## 2021-08-18 ENCOUNTER — Ambulatory Visit (INDEPENDENT_AMBULATORY_CARE_PROVIDER_SITE_OTHER): Payer: Medicaid Other | Admitting: Physician Assistant

## 2021-08-18 VITALS — BP 93/61 | HR 84

## 2021-08-18 DIAGNOSIS — G08 Intracranial and intraspinal phlebitis and thrombophlebitis: Secondary | ICD-10-CM

## 2021-08-18 DIAGNOSIS — Z7901 Long term (current) use of anticoagulants: Secondary | ICD-10-CM

## 2021-08-18 LAB — POCT INR: INR: 2.1 (ref 2.0–3.0)

## 2021-08-18 NOTE — Progress Notes (Signed)
Same dose. Recheck in 4 weeks

## 2021-08-19 ENCOUNTER — Other Ambulatory Visit: Payer: Self-pay | Admitting: Physician Assistant

## 2021-08-19 DIAGNOSIS — G479 Sleep disorder, unspecified: Secondary | ICD-10-CM

## 2021-09-15 ENCOUNTER — Encounter: Payer: Self-pay | Admitting: Physician Assistant

## 2021-09-15 ENCOUNTER — Other Ambulatory Visit: Payer: Self-pay | Admitting: Physician Assistant

## 2021-09-15 ENCOUNTER — Ambulatory Visit: Payer: Medicaid Other

## 2021-09-15 ENCOUNTER — Ambulatory Visit: Payer: Medicaid Other | Admitting: Physician Assistant

## 2021-09-15 DIAGNOSIS — G44221 Chronic tension-type headache, intractable: Secondary | ICD-10-CM | POA: Diagnosis not present

## 2021-09-15 DIAGNOSIS — E785 Hyperlipidemia, unspecified: Secondary | ICD-10-CM

## 2021-09-15 DIAGNOSIS — Z79899 Other long term (current) drug therapy: Secondary | ICD-10-CM | POA: Diagnosis not present

## 2021-09-15 DIAGNOSIS — R7309 Other abnormal glucose: Secondary | ICD-10-CM | POA: Diagnosis not present

## 2021-09-15 DIAGNOSIS — Z7901 Long term (current) use of anticoagulants: Secondary | ICD-10-CM

## 2021-09-15 DIAGNOSIS — E109 Type 1 diabetes mellitus without complications: Secondary | ICD-10-CM | POA: Diagnosis not present

## 2021-09-15 DIAGNOSIS — Z1329 Encounter for screening for other suspected endocrine disorder: Secondary | ICD-10-CM | POA: Diagnosis not present

## 2021-09-15 DIAGNOSIS — Z23 Encounter for immunization: Secondary | ICD-10-CM | POA: Diagnosis not present

## 2021-09-15 DIAGNOSIS — R739 Hyperglycemia, unspecified: Secondary | ICD-10-CM

## 2021-09-15 MED ORDER — PREGABALIN 75 MG PO CAPS: 75.0000 mg | ORAL_CAPSULE | Freq: Two times a day (BID) | ORAL | 2 refills | Status: DC

## 2021-09-15 NOTE — Progress Notes (Unsigned)
Established Patient Office Visit  Subjective   Patient ID: Valerie Pratt, female    DOB: August 22, 1965  Age: 56 y.o. MRN: [id number]  Chief Complaint  Patient presents with   Follow-up    HPI Pt is a 56 yo anti-coagulated female with T1DM, COPD, cervical DDD, HLD with chronic tension headaches who presents to the clinic for follow up.   She manages her sugars well and works with endocrinology.   Her breathing is doing great.   She continues to have headaches daily. Baclofen stopped working.   Active Ambulatory Problems    Diagnosis Date Noted   History of pancreatitis 05/29/2015   Constipation 05/29/2015   Cough 05/29/2015   Unintentional weight loss 05/29/2015   Elevated random blood glucose level 05/29/2015   Type I diabetes mellitus (HCC) 06/04/2015   Uncontrolled type 1 diabetes mellitus with diabetic autonomic neuropathy 06/28/2015   Current smoker 08/26/2015   Chronic obstructive pulmonary disease (HCC) 08/26/2015   Depression 08/26/2015   Former smoker 09/30/2015   Sleeping difficulties 09/30/2015   GAD (generalized anxiety disorder) 12/16/2015   Breast cancer screening 01/13/2016   Screening for lipid disorders 01/13/2016   Colon cancer screening 01/13/2016   Nuclear sclerotic cataract of both eyes 01/15/2016   Chronic pancreatitis (HCC) 06/10/2017   Dyslipidemia, goal LDL below 70 06/10/2017   Traumatic wound dehiscence 08/27/2017   Forearm laceration with complication, left, sequela 08/27/2017   Cerebral venous sinus thrombosis 01/14/2018   Ankle edema, bilateral 01/14/2018   Closed skull fracture (HCC) 01/14/2018   Syncope and collapse 01/14/2018   Chronic post-traumatic headache, not intractable 01/14/2018   Weight gain 01/14/2018   Orthostatic hypotension 01/14/2018   Memory changes 01/14/2018   Intractable chronic post-traumatic headache 12/20/2018   Abdominal wound dehiscence 02/07/2019   Cellulitis of abdominal wall 02/07/2019   Chronic  tension-type headache, intractable 09/27/2019   DDD (degenerative disc disease), cervical 09/27/2019   Foraminal stenosis of cervical region 10/04/2019   Resolved Ambulatory Problems    Diagnosis Date Noted   Excessive thirst 05/29/2015   Past Medical History:  Diagnosis Date   Anxiety    COPD (chronic obstructive pulmonary disease) (HCC)    Diabetes mellitus without complication (HCC)    Insomnia     ROS See HPI.    Objective:     There were no vitals taken for this visit. BP Readings from Last 3 Encounters:  08/18/21 93/61  07/21/21 105/66  06/23/21 107/66   Wt Readings from Last 3 Encounters:  06/23/21 105 lb 1.3 oz (47.7 kg)  04/28/21 106 lb 3.2 oz (48.2 kg)  12/04/20 111 lb (50.3 kg)      Physical Exam Constitutional:      Appearance: Normal appearance.  HENT:     Head: Normocephalic.  Cardiovascular:     Rate and Rhythm: Normal rate and regular rhythm.     Pulses: Normal pulses.  Pulmonary:     Effort: Pulmonary effort is normal.     Breath sounds: Normal breath sounds.  Musculoskeletal:     Cervical back: Normal range of motion and neck supple.     Right lower leg: No edema.     Left lower leg: No edema.  Neurological:     General: No focal deficit present.     Mental Status: She is alert and oriented to person, place, and time.  Psychiatric:        Mood and Affect: Mood normal.  Assessment & Plan:  Marland KitchenMarland KitchenAlonzo was seen today for follow-up.  Diagnoses and all orders for this visit:  Anticoagulated -     INR/PT  Hyperlipidemia LDL goal <70 -     Lipid Panel w/reflex Direct LDL  Thyroid disorder screen -     TSH  Elevated random blood glucose level -     COMPLETE METABOLIC PANEL WITH GFR  Medication management -     TSH -     Lipid Panel w/reflex Direct LDL -     COMPLETE METABOLIC PANEL WITH GFR -     CBC with Differential/Platelet -     INR/PT  Type 1 diabetes mellitus without complication (HCC) -     Urine  Microalbumin w/creat. ratio -     Hemoglobin A1c  Flu vaccine need -     Flu Vaccine QUAD 42mo+IM (Fluarix, Fluzone & Alfiuria Quad PF)  Chronic tension-type headache, intractable -     pregabalin (LYRICA) 75 MG capsule; Take 1 capsule (75 mg total) by mouth 2 (two) times daily.   Continue on same dose of warfarin for now INR ordered in blood Follow up in 4 weeks(nurse visit)  Added lyrica to see if would help with HA, continue baclofen at bedtime. 3 month follow up.   DM management by endocrinology  Flu shot given today Shingles to get at pharmacy    Return in about 3 months (around 12/15/2021).    Tandy Gaw, PA-C

## 2021-09-16 LAB — HEMOGLOBIN A1C
Hgb A1c MFr Bld: 6.7 % of total Hgb — ABNORMAL HIGH (ref <5.7)
Mean Plasma Glucose: 146 mg/dL
eAG (mmol/L): 8.1 mmol/L

## 2021-09-16 LAB — PROTIME-INR
INR: 2.1 — ABNORMAL HIGH
Prothrombin Time: 21.1 s — ABNORMAL HIGH (ref 9.0–11.5)

## 2021-09-16 LAB — CBC WITH DIFFERENTIAL/PLATELET
Absolute Monocytes: 482 cells/uL (ref 200–950)
Basophils Absolute: 40 cells/uL (ref 0–200)
Basophils Relative: 0.6 %
Eosinophils Absolute: 40 cells/uL (ref 15–500)
Eosinophils Relative: 0.6 %
HCT: 43 % (ref 35.0–45.0)
Hemoglobin: 13.8 g/dL (ref 11.7–15.5)
Lymphs Abs: 1467 cells/uL (ref 850–3900)
MCH: 29.9 pg (ref 27.0–33.0)
MCHC: 32.1 g/dL (ref 32.0–36.0)
MCV: 93.1 fL (ref 80.0–100.0)
MPV: 9.2 fL (ref 7.5–12.5)
Monocytes Relative: 7.2 %
Neutro Abs: 4670 cells/uL (ref 1500–7800)
Neutrophils Relative %: 69.7 %
Platelets: 269 10*3/uL (ref 140–400)
RBC: 4.62 10*6/uL (ref 3.80–5.10)
RDW: 12.4 % (ref 11.0–15.0)
Total Lymphocyte: 21.9 %
WBC: 6.7 10*3/uL (ref 3.8–10.8)

## 2021-09-16 LAB — COMPLETE METABOLIC PANEL WITH GFR
AG Ratio: 1.6 (calc) (ref 1.0–2.5)
ALT: 21 U/L (ref 6–29)
AST: 28 U/L (ref 10–35)
Albumin: 4.5 g/dL (ref 3.6–5.1)
Alkaline phosphatase (APISO): 66 U/L (ref 37–153)
BUN: 14 mg/dL (ref 7–25)
CO2: 28 mmol/L (ref 20–32)
Calcium: 9.7 mg/dL (ref 8.6–10.4)
Chloride: 102 mmol/L (ref 98–110)
Creat: 0.66 mg/dL (ref 0.50–1.03)
Globulin: 2.8 g/dL (calc) (ref 1.9–3.7)
Glucose, Bld: 112 mg/dL — ABNORMAL HIGH (ref 65–99)
Potassium: 4.5 mmol/L (ref 3.5–5.3)
Sodium: 142 mmol/L (ref 135–146)
Total Bilirubin: 0.6 mg/dL (ref 0.2–1.2)
Total Protein: 7.3 g/dL (ref 6.1–8.1)
eGFR: 102 mL/min/{1.73_m2} (ref >=60)

## 2021-09-16 LAB — LIPID PANEL W/REFLEX DIRECT LDL
Cholesterol: 171 mg/dL (ref <200)
HDL: 86 mg/dL (ref >=50)
LDL Cholesterol (Calc): 71 mg/dL (calc)
Non-HDL Cholesterol (Calc): 85 mg/dL (calc) (ref <130)
Total CHOL/HDL Ratio: 2 (calc) (ref <5.0)
Triglycerides: 68 mg/dL (ref <150)

## 2021-09-16 LAB — MICROALBUMIN / CREATININE URINE RATIO
Creatinine, Urine: 40 mg/dL (ref 20–275)
Microalb Creat Ratio: 28 mcg/mg creat (ref <30)
Microalb, Ur: 1.1 mg/dL

## 2021-09-16 LAB — TSH: TSH: 0.52 mIU/L (ref 0.40–4.50)

## 2021-09-16 NOTE — Progress Notes (Signed)
Valerie Pratt,   Normal protein in urine.  Normal hemoglobin.  Cholesterol looks great.  Thyroid looks great.  A1C 6.7.   INR stable and in range.  Recheck nurse visit one month.

## 2021-09-17 ENCOUNTER — Other Ambulatory Visit: Payer: Self-pay | Admitting: Family Medicine

## 2021-09-17 DIAGNOSIS — M5030 Other cervical disc degeneration, unspecified cervical region: Secondary | ICD-10-CM

## 2021-09-17 DIAGNOSIS — G44221 Chronic tension-type headache, intractable: Secondary | ICD-10-CM

## 2021-09-18 MED ORDER — BACLOFEN 10 MG PO TABS: 10.0000 mg | ORAL_TABLET | Freq: Three times a day (TID) | ORAL | 0 refills | Status: DC

## 2021-10-01 ENCOUNTER — Other Ambulatory Visit: Payer: Self-pay | Admitting: Physician Assistant

## 2021-10-02 ENCOUNTER — Other Ambulatory Visit: Payer: Self-pay | Admitting: Physician Assistant

## 2021-10-02 ENCOUNTER — Encounter: Payer: Self-pay | Admitting: Physician Assistant

## 2021-10-02 DIAGNOSIS — G08 Intracranial and intraspinal phlebitis and thrombophlebitis: Secondary | ICD-10-CM

## 2021-10-03 MED ORDER — WARFARIN SODIUM 5 MG PO TABS: ORAL_TABLET | ORAL | 1 refills | Status: DC

## 2021-10-03 NOTE — Addendum Note (Signed)
Addended by: Narda Rutherford on: 10/03/2021 01:09 PM   Modules accepted: Orders

## 2021-10-06 ENCOUNTER — Ambulatory Visit: Payer: Medicaid Other

## 2021-10-11 ENCOUNTER — Other Ambulatory Visit: Payer: Self-pay | Admitting: Physician Assistant

## 2021-10-11 DIAGNOSIS — F411 Generalized anxiety disorder: Secondary | ICD-10-CM

## 2021-10-11 DIAGNOSIS — F331 Major depressive disorder, recurrent, moderate: Secondary | ICD-10-CM

## 2021-10-12 ENCOUNTER — Other Ambulatory Visit: Payer: Self-pay | Admitting: Physician Assistant

## 2021-10-12 DIAGNOSIS — G44221 Chronic tension-type headache, intractable: Secondary | ICD-10-CM

## 2021-10-12 DIAGNOSIS — M5030 Other cervical disc degeneration, unspecified cervical region: Secondary | ICD-10-CM

## 2021-10-20 ENCOUNTER — Ambulatory Visit: Payer: Medicaid Other | Admitting: Physician Assistant

## 2021-10-20 ENCOUNTER — Other Ambulatory Visit: Payer: Self-pay

## 2021-10-20 DIAGNOSIS — Z7901 Long term (current) use of anticoagulants: Secondary | ICD-10-CM

## 2021-10-20 LAB — POCT INR: INR: 2.3 (ref 2.0–3.0)

## 2021-10-20 MED ORDER — WARFARIN SODIUM 7.5 MG PO TABS: ORAL_TABLET | ORAL | 1 refills | Status: DC

## 2021-10-20 NOTE — Patient Instructions (Signed)
Same dose follow up in 4 weeks.  

## 2021-10-20 NOTE — Progress Notes (Signed)
Same dose follow up in 4 weeks.

## 2021-10-25 ENCOUNTER — Other Ambulatory Visit: Payer: Self-pay | Admitting: Physician Assistant

## 2021-10-25 DIAGNOSIS — G479 Sleep disorder, unspecified: Secondary | ICD-10-CM

## 2021-10-30 DIAGNOSIS — H5213 Myopia, bilateral: Secondary | ICD-10-CM | POA: Diagnosis not present

## 2021-11-14 ENCOUNTER — Other Ambulatory Visit: Payer: Self-pay | Admitting: Family Medicine

## 2021-11-14 DIAGNOSIS — M5030 Other cervical disc degeneration, unspecified cervical region: Secondary | ICD-10-CM

## 2021-11-14 DIAGNOSIS — G44221 Chronic tension-type headache, intractable: Secondary | ICD-10-CM

## 2021-11-17 ENCOUNTER — Ambulatory Visit: Payer: Medicaid Other

## 2021-11-18 ENCOUNTER — Ambulatory Visit: Payer: Medicaid Other

## 2021-11-18 ENCOUNTER — Encounter: Payer: Self-pay | Admitting: Physician Assistant

## 2021-12-15 ENCOUNTER — Ambulatory Visit: Payer: Medicaid Other | Admitting: Physician Assistant

## 2021-12-15 VITALS — BP 116/64 | HR 70 | Ht 68.0 in | Wt 101.0 lb

## 2021-12-15 DIAGNOSIS — Z7901 Long term (current) use of anticoagulants: Secondary | ICD-10-CM

## 2021-12-15 LAB — POCT INR
INR: 2.2 (ref 2.0–3.0)
POC INR: 2.2

## 2021-12-15 NOTE — Progress Notes (Signed)
Stay on same dose and recheck in 4 weeks.

## 2021-12-18 ENCOUNTER — Other Ambulatory Visit: Payer: Self-pay | Admitting: Physician Assistant

## 2021-12-18 DIAGNOSIS — M5030 Other cervical disc degeneration, unspecified cervical region: Secondary | ICD-10-CM

## 2021-12-18 DIAGNOSIS — G44221 Chronic tension-type headache, intractable: Secondary | ICD-10-CM

## 2021-12-31 ENCOUNTER — Other Ambulatory Visit: Payer: Self-pay | Admitting: Physician Assistant

## 2021-12-31 DIAGNOSIS — Z0000 Encounter for general adult medical examination without abnormal findings: Secondary | ICD-10-CM

## 2021-12-31 DIAGNOSIS — E785 Hyperlipidemia, unspecified: Secondary | ICD-10-CM

## 2022-01-12 ENCOUNTER — Ambulatory Visit: Payer: Medicaid Other | Admitting: Physician Assistant

## 2022-01-12 VITALS — BP 120/69 | HR 88 | Ht 68.0 in | Wt 100.0 lb

## 2022-01-12 DIAGNOSIS — Z7901 Long term (current) use of anticoagulants: Secondary | ICD-10-CM

## 2022-01-12 LAB — PROTIME-INR: INR: 3.5 — AB (ref 0.80–1.20)

## 2022-01-12 NOTE — Progress Notes (Signed)
   Subjective:    Patient ID: Valerie Pratt, female    DOB: [birth year], 57 y.o.   MRN: [id number]  HPI Pt here for INR check.   Review of Systems     Objective:   Physical Exam        Assessment & Plan:   Pts INR today was 3.5, pt denies any missed doses, changes in diet or abnormal bleeding. Pt advised to take 7.5mg  M,W,F and 3.75mg  all other days and follow up in 2 weeks.

## 2022-01-12 NOTE — Progress Notes (Signed)
INR elevated decrease 7.5mg  to 3 days a week and 3.75 to 4 days a week.

## 2022-01-15 ENCOUNTER — Other Ambulatory Visit: Payer: Self-pay | Admitting: Physician Assistant

## 2022-01-15 DIAGNOSIS — M5030 Other cervical disc degeneration, unspecified cervical region: Secondary | ICD-10-CM

## 2022-01-15 DIAGNOSIS — G44221 Chronic tension-type headache, intractable: Secondary | ICD-10-CM

## 2022-01-19 ENCOUNTER — Other Ambulatory Visit: Payer: Self-pay | Admitting: Physician Assistant

## 2022-01-26 ENCOUNTER — Ambulatory Visit: Payer: Medicaid Other | Admitting: Physician Assistant

## 2022-01-26 VITALS — BP 125/65 | HR 86 | Ht 68.0 in | Wt 98.0 lb

## 2022-01-26 DIAGNOSIS — Z7901 Long term (current) use of anticoagulants: Secondary | ICD-10-CM

## 2022-01-26 LAB — POCT INR
POC INR: 3
POC INR: 3.5

## 2022-01-26 NOTE — Progress Notes (Signed)
Stay at same dose and recheck in 2 weeks.

## 2022-01-26 NOTE — Progress Notes (Signed)
Down from 3.5 to 3.0. lets hold and not make any changes and see what this does in 2 weeks.

## 2022-01-26 NOTE — Progress Notes (Signed)
   Subjective:    Patient ID: Valerie Pratt, female    DOB: 1965-10-25, 57 y.o.   MRN: [id number]  HPI Pt here for INR   Review of Systems     Objective:   Physical Exam        Assessment & Plan:  Pts INR today was 3.0, pt advised to continue current dosage of 7.5, MWF and 3.75 all other days, pt advised to return in 2 weeks for another check.

## 2022-02-10 ENCOUNTER — Ambulatory Visit: Payer: Medicaid Other | Admitting: Physician Assistant

## 2022-02-10 VITALS — BP 127/67 | HR 67 | Temp 97.5°F

## 2022-02-10 DIAGNOSIS — Z7901 Long term (current) use of anticoagulants: Secondary | ICD-10-CM | POA: Diagnosis not present

## 2022-02-10 LAB — POCT INR: INR: 2.1 (ref 2.0–3.0)

## 2022-02-10 NOTE — Progress Notes (Signed)
Normal range. Follow up in 4 weeks.

## 2022-02-12 ENCOUNTER — Other Ambulatory Visit: Payer: Self-pay | Admitting: Physician Assistant

## 2022-02-13 ENCOUNTER — Other Ambulatory Visit: Payer: Self-pay | Admitting: Physician Assistant

## 2022-02-13 DIAGNOSIS — G479 Sleep disorder, unspecified: Secondary | ICD-10-CM

## 2022-02-13 DIAGNOSIS — M5030 Other cervical disc degeneration, unspecified cervical region: Secondary | ICD-10-CM

## 2022-02-13 DIAGNOSIS — G44221 Chronic tension-type headache, intractable: Secondary | ICD-10-CM

## 2022-02-16 NOTE — Telephone Encounter (Signed)
Pregabalin  Last OV: 09/15/21  Next OV: 03/16/22 Last RF: 09/15/21

## 2022-03-05 ENCOUNTER — Other Ambulatory Visit: Payer: Self-pay | Admitting: Physician Assistant

## 2022-03-09 ENCOUNTER — Ambulatory Visit: Payer: Medicaid Other | Admitting: Physician Assistant

## 2022-03-09 VITALS — BP 134/62 | HR 78 | Ht 68.0 in

## 2022-03-09 DIAGNOSIS — Z7901 Long term (current) use of anticoagulants: Secondary | ICD-10-CM | POA: Diagnosis not present

## 2022-03-09 LAB — POCT INR: INR: 2.8 (ref 2.0–3.0)

## 2022-03-09 NOTE — Progress Notes (Signed)
Stay on same dose. Follow up in 4 weeks.

## 2022-03-15 ENCOUNTER — Encounter: Payer: Self-pay | Admitting: Physician Assistant

## 2022-03-16 ENCOUNTER — Ambulatory Visit: Payer: Medicaid Other | Admitting: Physician Assistant

## 2022-03-16 DIAGNOSIS — E109 Type 1 diabetes mellitus without complications: Secondary | ICD-10-CM

## 2022-03-29 ENCOUNTER — Other Ambulatory Visit: Payer: Self-pay | Admitting: Physician Assistant

## 2022-03-29 DIAGNOSIS — E785 Hyperlipidemia, unspecified: Secondary | ICD-10-CM

## 2022-03-29 DIAGNOSIS — Z0000 Encounter for general adult medical examination without abnormal findings: Secondary | ICD-10-CM

## 2022-03-31 ENCOUNTER — Ambulatory Visit (INDEPENDENT_AMBULATORY_CARE_PROVIDER_SITE_OTHER): Payer: Medicaid Other | Admitting: Physician Assistant

## 2022-03-31 VITALS — BP 121/72 | HR 79 | Ht 68.0 in | Wt 96.0 lb

## 2022-03-31 DIAGNOSIS — E785 Hyperlipidemia, unspecified: Secondary | ICD-10-CM

## 2022-03-31 DIAGNOSIS — Z7901 Long term (current) use of anticoagulants: Secondary | ICD-10-CM | POA: Diagnosis not present

## 2022-03-31 DIAGNOSIS — E109 Type 1 diabetes mellitus without complications: Secondary | ICD-10-CM

## 2022-03-31 DIAGNOSIS — Z0000 Encounter for general adult medical examination without abnormal findings: Secondary | ICD-10-CM

## 2022-03-31 DIAGNOSIS — R636 Underweight: Secondary | ICD-10-CM | POA: Diagnosis not present

## 2022-03-31 DIAGNOSIS — F411 Generalized anxiety disorder: Secondary | ICD-10-CM

## 2022-03-31 DIAGNOSIS — M5030 Other cervical disc degeneration, unspecified cervical region: Secondary | ICD-10-CM

## 2022-03-31 DIAGNOSIS — R053 Chronic cough: Secondary | ICD-10-CM | POA: Diagnosis not present

## 2022-03-31 DIAGNOSIS — G44221 Chronic tension-type headache, intractable: Secondary | ICD-10-CM

## 2022-03-31 DIAGNOSIS — Z1231 Encounter for screening mammogram for malignant neoplasm of breast: Secondary | ICD-10-CM | POA: Diagnosis not present

## 2022-03-31 DIAGNOSIS — G479 Sleep disorder, unspecified: Secondary | ICD-10-CM | POA: Diagnosis not present

## 2022-03-31 DIAGNOSIS — F331 Major depressive disorder, recurrent, moderate: Secondary | ICD-10-CM | POA: Diagnosis not present

## 2022-03-31 LAB — POCT INR: INR: 1.5 — AB (ref 2.0–3.0)

## 2022-03-31 MED ORDER — PREGABALIN 75 MG PO CAPS: 75.0000 mg | ORAL_CAPSULE | Freq: Two times a day (BID) | ORAL | 1 refills | Status: DC

## 2022-03-31 MED ORDER — SERTRALINE HCL 100 MG PO TABS: ORAL_TABLET | ORAL | 1 refills | Status: DC

## 2022-03-31 MED ORDER — VENTOLIN HFA 108 (90 BASE) MCG/ACT IN AERS: INHALATION_SPRAY | RESPIRATORY_TRACT | 1 refills | Status: DC

## 2022-03-31 MED ORDER — TRAZODONE HCL 50 MG PO TABS: 50.0000 mg | ORAL_TABLET | Freq: Every day | ORAL | 1 refills | Status: DC

## 2022-03-31 MED ORDER — WARFARIN SODIUM 7.5 MG PO TABS: ORAL_TABLET | ORAL | 1 refills | Status: DC

## 2022-03-31 MED ORDER — BACLOFEN 10 MG PO TABS: ORAL_TABLET | ORAL | 1 refills | Status: DC

## 2022-03-31 MED ORDER — PRAVASTATIN SODIUM 20 MG PO TABS: 20.0000 mg | ORAL_TABLET | Freq: Every day | ORAL | 1 refills | Status: AC

## 2022-03-31 NOTE — Patient Instructions (Addendum)
Increase coumadin to 7mg  4 times a week and 3.75mg  3 times a week.  Recheck nurse visit in 2 weeks  60g of protein and try to eat every 2 hours Meal replacement shakes daily    Protein-Energy Malnutrition Protein-energy malnutrition is when a person does not eat enough protein, fat, and calories. When this happens over time, it can lead to severe loss of muscle tissue (muscle wasting). This condition also affects the body's defense system (immune system) and can lead to other health problems. What are the causes? This condition may be caused by: Not eating enough protein, fat, or calories. Having certain chronic medical conditions. Eating too little. What increases the risk? The following factors may make you more likely to develop this condition: Living in poverty. Long-term hospitalization. Alcohol or drug dependency. Addiction often leads to a lifestyle in which proper diet is ignored. Dependency can also hurt the metabolism and the body's ability to absorb nutrients. Eating disorders, such as anorexia nervosa or bulimia. Chewing or swallowing problems. People with these disorders may not eat enough. Having certain conditions, such as: Inflammatory bowel disease. Inflammation of the intestines makes it difficult for the body to absorb nutrients. Cancer or AIDS. These diseases can cause a loss of appetite. Chronic heart failure. This interferes with how the body uses nutrients. Cystic fibrosis. This disease can make it difficult for the body to absorb nutrients. Eating a diet that extremely restricts protein, fat, or calorie intake. What are the signs or symptoms? Symptoms of this condition include: Tiredness (fatigue). Weakness. Dizziness. Fainting. Weight loss. Loss of muscle tone and muscle mass. Poor immune response. Lack of menstruation. Poor memory. Hair loss. Skin changes. How is this diagnosed? This condition may be diagnosed based on: Your medical and dietary  history. A physical exam. This may include a measurement of your body mass index. Blood tests. How is this treated? This condition may be managed with: Nutrition therapy. This may include working with a Microbiologist. Treatment for underlying conditions. People with severe protein-energy malnutrition may need to be treated in a hospital. This may involve receiving nutrition and fluids through an IV. Follow these instructions at home:  Eat a balanced diet. In each meal, include at least one food that is high in protein. Foods that are high in protein include: Meat. Poultry. Fish. Eggs. Cheese. Milk. Beans. Nuts. Eat nutrient-rich foods that are easy to swallow and digest, such as: Fruit and yogurt smoothies. Oatmeal with nut butter. Nutrition supplement drinks. Try to eat six small meals each day instead of three large meals. Take vitamin and protein supplements as told by your health care provider or dietitian. Follow your health care provider's recommendations about exercise and activity. Keep all follow-up visits. This is important. Contact a health care provider if: You have increased weakness or fatigue. You faint. You are a woman and you stop having your period (menstruating). You have rapid hair loss. You have unexpected weight loss. You have diarrhea. You have nausea and vomiting. Get help right away if: You have difficulty breathing. You have chest pain. These symptoms may represent a serious problem that is an emergency. Do not wait to see if the symptoms will go away. Get medical help right away. Call your local emergency services (911 in the [place] Do not drive yourself to the hospital. Summary Protein-energy malnutrition is when a person does not eat enough protein, fat, and calories. Protein-energy malnutrition can lead to severe loss of muscle tissue (muscle wasting). This condition also  affects the body's defense system (immune system) and can lead to other health  problems. Talk with your health care provider about treatment for this condition. Effective treatment depends on the underlying cause of the malnutrition. This information is not intended to replace advice given to you by your health care provider. Make sure you discuss any questions you have with your health care provider. Document Revised: 12/23/2019 Document Reviewed: 12/23/2019 Elsevier Patient Education  [place].

## 2022-03-31 NOTE — Progress Notes (Signed)
Established Patient Office Visit  Subjective   Patient ID: Valerie Pratt, female    DOB: [birth year]  Age: 57 y.o. MRN: [id number]  Chief Complaint  Patient presents with   Follow-up    HPI Pt is a 56 yo underweight female with T1DM, HLD, anticoagulated due to blood clotting history, GAD and cervical DDD with headaches who presents to the clinic for follow up.   She does see endocrinology for DM management.   She continues to have cervical headaches. Baclofen does help and she needs refills.   Mood and sleep are overall controlled and she is feeling ok.   .. Active Ambulatory Problems    Diagnosis Date Noted   History of pancreatitis 05/29/2015   Constipation 05/29/2015   Cough 05/29/2015   Unintentional weight loss 05/29/2015   Elevated random blood glucose level 05/29/2015   Type I diabetes mellitus 06/04/2015   Uncontrolled type 1 diabetes mellitus with diabetic autonomic neuropathy 06/28/2015   Current smoker 08/26/2015   Chronic obstructive pulmonary disease 08/26/2015   Depression 08/26/2015   Former smoker 09/30/2015   Sleeping difficulties 09/30/2015   GAD (generalized anxiety disorder) 12/16/2015   Breast cancer screening 01/13/2016   Screening for lipid disorders 01/13/2016   Colon cancer screening 01/13/2016   Nuclear sclerotic cataract of both eyes 01/15/2016   Chronic pancreatitis 06/10/2017   Dyslipidemia, goal LDL below 70 06/10/2017   Traumatic wound dehiscence 08/27/2017   Forearm laceration with complication, left, sequela 08/27/2017   Cerebral venous sinus thrombosis 01/14/2018   Ankle edema, bilateral 01/14/2018   Closed skull fracture 01/14/2018   Syncope and collapse 01/14/2018   Chronic post-traumatic headache, not intractable 01/14/2018   Weight gain 01/14/2018   Orthostatic hypotension 01/14/2018   Memory changes 01/14/2018   Intractable chronic post-traumatic headache 12/20/2018   Abdominal wound dehiscence 02/07/2019   Cellulitis of  abdominal wall 02/07/2019   Chronic tension-type headache, intractable 09/27/2019   DDD (degenerative disc disease), cervical 09/27/2019   Foraminal stenosis of cervical region 10/04/2019   Anticoagulated 02/10/2022   Resolved Ambulatory Problems    Diagnosis Date Noted   Excessive thirst 05/29/2015   Past Medical History:  Diagnosis Date   Anxiety    COPD (chronic obstructive pulmonary disease)    Diabetes mellitus without complication    Insomnia      ROS See HPI.    Objective:     BP 121/72   Pulse 79   Ht 5\' 8"  (1.727 m)   Wt 96 lb (43.5 kg)   SpO2 97%   BMI 14.60 kg/m  BP Readings from Last 3 Encounters:  03/31/22 121/72  03/09/22 134/62  02/10/22 127/67   Wt Readings from Last 3 Encounters:  03/31/22 96 lb (43.5 kg)  01/26/22 98 lb (44.5 kg)  01/12/22 100 lb (45.4 kg)        03/31/2022   11:19 AM 09/15/2021    9:16 AM 12/04/2020    8:52 AM 04/16/2020   10:22 AM 12/14/2018   10:01 AM  Depression screen PHQ 2/9  Decreased Interest 0 0 1 1 1   Down, Depressed, Hopeless 1 1 0 2 1  PHQ - 2 Score 1 1 1 3 2   Altered sleeping 2  2 3 1   Tired, decreased energy 1  1 1 1   Change in appetite 0  0 1 0  Feeling bad or failure about yourself  0  0 1 0  Trouble concentrating 2  1 1 1   Moving slowly or  fidgety/restless 3  0 1 0  Suicidal thoughts 0  0 0 0  PHQ-9 Score 9  5 11 5   Difficult doing work/chores Somewhat difficult  Not difficult at all Somewhat difficult Somewhat difficult   ..    03/31/2022   11:19 AM 12/04/2020    8:53 AM 04/16/2020   10:22 AM 12/14/2018   10:03 AM  GAD 7 : Generalized Anxiety Score  Nervous, Anxious, on Edge 1 1 1 1   Control/stop worrying 1 1 1 1   Worry too much - different things 1 1 1 1   Trouble relaxing 2 2 1 1   Restless 2 1 1 1   Easily annoyed or irritable 1 1 1  0  Afraid - awful might happen 0 0 0 1  Total GAD 7 Score 8 7 6 6   Anxiety Difficulty Somewhat difficult Somewhat difficult Somewhat difficult Somewhat difficult       Physical Exam Constitutional:      Appearance: Normal appearance.     Comments: Thin   HENT:     Head: Normocephalic.  Cardiovascular:     Rate and Rhythm: Normal rate and regular rhythm.  Pulmonary:     Effort: Pulmonary effort is normal.     Breath sounds: Normal breath sounds.  Musculoskeletal:     Right lower leg: No edema.     Left lower leg: No edema.  Neurological:     General: No focal deficit present.     Mental Status: She is alert and oriented to person, place, and time.  Psychiatric:        Mood and Affect: Mood normal.      Results for orders placed or performed in visit on 03/31/22  Hemoglobin A1c  Result Value Ref Range   Hgb A1c MFr Bld 8.2 (H) <5.7 % of total Hgb   Mean Plasma Glucose 189 mg/dL   eAG (mmol/L) 10.4 mmol/L  COMPLETE METABOLIC PANEL WITH GFR  Result Value Ref Range   Glucose, Bld 181 (H) 65 - 99 mg/dL   BUN 19 7 - 25 mg/dL   Creat 0.50 0.50 - 1.03 mg/dL   eGFR 109 > OR = 60 mL/min/1.36m2   BUN/Creatinine Ratio SEE NOTE: 6 - 22 (calc)   Sodium 140 135 - 146 mmol/L   Potassium 4.4 3.5 - 5.3 mmol/L   Chloride 103 98 - 110 mmol/L   CO2 30 20 - 32 mmol/L   Calcium 9.4 8.6 - 10.4 mg/dL   Total Protein 7.5 6.1 - 8.1 g/dL   Albumin 4.7 3.6 - 5.1 g/dL   Globulin 2.8 1.9 - 3.7 g/dL (calc)   AG Ratio 1.7 1.0 - 2.5 (calc)   Total Bilirubin 0.6 0.2 - 1.2 mg/dL   Alkaline phosphatase (APISO) 59 37 - 153 U/L   AST 23 10 - 35 U/L   ALT 19 6 - 29 U/L  POCT INR  Result Value Ref Range   INR 1.5 (A) 2.0 - 3.0   POC INR          Assessment & Plan:  Marland KitchenMarland KitchenElbia was seen today for follow-up.  Diagnoses and all orders for this visit:  Anticoagulated -     POCT INR -     warfarin (COUMADIN) 7.5 MG tablet; Take 1 tablet (7.5 mg total) by mouth 4 (four) times a week AND 0.5 tablets (3.75 mg total) 3 (three) times a week.  Encounter for screening mammogram for malignant neoplasm of breast -     MM 3D SCREENING  MAMMOGRAM BILATERAL  BREAST  DDD (degenerative disc disease), cervical -     baclofen (LIORESAL) 10 MG tablet; TAKE 1 TABLET BY MOUTH THREE TIMES DAILY . APPOINTMENT REQUIRED FOR FUTURE REFILLS  Chronic tension-type headache, intractable -     baclofen (LIORESAL) 10 MG tablet; TAKE 1 TABLET BY MOUTH THREE TIMES DAILY . APPOINTMENT REQUIRED FOR FUTURE REFILLS -     pregabalin (LYRICA) 75 MG capsule; Take 1 capsule (75 mg total) by mouth 2 (two) times daily.  Preventative health care -     pravastatin (PRAVACHOL) 20 MG tablet; Take 1 tablet (20 mg total) by mouth daily.  Hyperlipidemia LDL goal <70 -     pravastatin (PRAVACHOL) 20 MG tablet; Take 1 tablet (20 mg total) by mouth daily.  GAD (generalized anxiety disorder) -     sertraline (ZOLOFT) 100 MG tablet; TAKE 1 & 1/2 (ONE & ONE-HALF) TABLETS BY MOUTH ONCE DAILY .  Moderate episode of recurrent major depressive disorder (HCC) -     sertraline (ZOLOFT) 100 MG tablet; TAKE 1 & 1/2 (ONE & ONE-HALF) TABLETS BY MOUTH ONCE DAILY .  Sleeping difficulties -     traZODone (DESYREL) 50 MG tablet; Take 1-2 tablets (50-100 mg total) by mouth at bedtime.  Underweight  Type 1 diabetes mellitus without complication (HCC) -     Hemoglobin A1c -     COMPLETE METABOLIC PANEL WITH GFR  Chronic cough -     VENTOLIN HFA 108 (90 Base) MCG/ACT inhaler; INHALE 2 PUFFS INTO LUNGS EVERY 6 HOURS AS NEEDED FOR WHEEZING AND FOR SHORTNESS OF BREATH    INR not to goal Increase coumadin to 7mg  4 times a week and 3.75mg  3 times a week Recheck in 2 weeks  Underweight discussion-lost 4lbs since january Declined megace for now Try to get 60g of protein a day Try to eat every 2 hours Consider meal replacement shakes  Mammogram ordered Vaccines declined   Return in about 2 weeks (around 04/14/2022) for INR check nurse visit.   3 month follow up in office   Iran Planas, PA-C

## 2022-04-01 LAB — COMPLETE METABOLIC PANEL WITH GFR
AG Ratio: 1.7 (calc) (ref 1.0–2.5)
ALT: 19 U/L (ref 6–29)
AST: 23 U/L (ref 10–35)
Albumin: 4.7 g/dL (ref 3.6–5.1)
Alkaline phosphatase (APISO): 59 U/L (ref 37–153)
BUN: 19 mg/dL (ref 7–25)
CO2: 30 mmol/L (ref 20–32)
Calcium: 9.4 mg/dL (ref 8.6–10.4)
Chloride: 103 mmol/L (ref 98–110)
Creat: 0.5 mg/dL (ref 0.50–1.03)
Globulin: 2.8 g/dL (calc) (ref 1.9–3.7)
Glucose, Bld: 181 mg/dL — ABNORMAL HIGH (ref 65–99)
Potassium: 4.4 mmol/L (ref 3.5–5.3)
Sodium: 140 mmol/L (ref 135–146)
Total Bilirubin: 0.6 mg/dL (ref 0.2–1.2)
Total Protein: 7.5 g/dL (ref 6.1–8.1)
eGFR: 109 mL/min/{1.73_m2} (ref >=60)

## 2022-04-01 LAB — HEMOGLOBIN A1C
Hgb A1c MFr Bld: 8.2 %{Hb} — ABNORMAL HIGH
Mean Plasma Glucose: 189 mg/dL
eAG (mmol/L): 10.4 mmol/L

## 2022-04-01 NOTE — Progress Notes (Signed)
Valerie Pratt,   Kidney and liver look good.  Glucose is trending up.  A1C is not controlled at 8.2.  You need to follow up with endocrinology to discuss.

## 2022-04-07 ENCOUNTER — Encounter: Payer: Self-pay | Admitting: Physician Assistant

## 2022-04-13 ENCOUNTER — Ambulatory Visit: Payer: Medicaid Other | Admitting: Physician Assistant

## 2022-04-13 DIAGNOSIS — Z7901 Long term (current) use of anticoagulants: Secondary | ICD-10-CM | POA: Diagnosis not present

## 2022-04-13 LAB — POCT INR: INR: 2.3 (ref 2.0–3.0)

## 2022-04-13 NOTE — Progress Notes (Signed)
Agree with above plan.  Stay on same dose follow up in 4 weeks.

## 2022-05-06 ENCOUNTER — Ambulatory Visit: Payer: Medicaid Other

## 2022-05-08 ENCOUNTER — Other Ambulatory Visit: Payer: Self-pay | Admitting: Physician Assistant

## 2022-05-08 DIAGNOSIS — R053 Chronic cough: Secondary | ICD-10-CM

## 2022-05-12 ENCOUNTER — Ambulatory Visit: Payer: Medicaid Other | Admitting: Physician Assistant

## 2022-05-12 VITALS — BP 119/62 | HR 81

## 2022-05-12 DIAGNOSIS — Z7901 Long term (current) use of anticoagulants: Secondary | ICD-10-CM

## 2022-05-12 LAB — POCT INR: INR: 1.6 — AB (ref 2.0–3.0)

## 2022-05-12 NOTE — Progress Notes (Signed)
Pt was on 7.5mg  M, W, F and 3.75 all other days.  INR low.  Increase coumadin to 7.5mg  on M,W,F, Sat and 3.75mg  all other days. Recheck in 2 weeks.

## 2022-05-12 NOTE — Patient Instructions (Signed)
Pt was on 3.75mg  M, W, F and 7mg  all other days.  INR low.  Increase coumadin to 3.75mg  on M and F and 7mg  all other days Tuesday/Wednesday/Thursday/Saturday/Sunday. Recheck in 2 weeks.

## 2022-05-13 NOTE — Progress Notes (Signed)
Patient advised.

## 2022-05-26 ENCOUNTER — Ambulatory Visit: Payer: Medicaid Other | Admitting: Physician Assistant

## 2022-05-26 DIAGNOSIS — Z7901 Long term (current) use of anticoagulants: Secondary | ICD-10-CM

## 2022-05-26 LAB — POCT INR: INR: 1.9 — AB (ref 2.0–3.0)

## 2022-05-26 NOTE — Progress Notes (Signed)
Bay reports taking 7.5 mg Monday, Wednesday, Friday and Saturday and 3.75 mg the rest of the week. Valerie Pratt, RMA  Almost in window. Increase to 7.5mg  Sunday, Monday, Wednesday, Friday, Saturday and 3.75mg  on Tuesday and Thursday. Recheck in 3 weeks.

## 2022-05-26 NOTE — Patient Instructions (Signed)
Almost in window. Increase to 7.5mg  Sunday, Monday, Wednesday, Friday, Saturday and 3.75mg  on Tuesday and Thursday. Recheck in 3 weeks.

## 2022-06-03 NOTE — Progress Notes (Signed)
Patient had been informed by phone and did make the recommended changes. She is scheduled for INR check on Jun 11,2024 as  nurse visit.

## 2022-06-06 ENCOUNTER — Other Ambulatory Visit: Payer: Self-pay | Admitting: Physician Assistant

## 2022-06-06 DIAGNOSIS — R053 Chronic cough: Secondary | ICD-10-CM

## 2022-06-09 ENCOUNTER — Ambulatory Visit: Payer: Medicaid Other

## 2022-06-10 ENCOUNTER — Ambulatory Visit (INDEPENDENT_AMBULATORY_CARE_PROVIDER_SITE_OTHER): Payer: Medicaid Other

## 2022-06-10 DIAGNOSIS — Z1231 Encounter for screening mammogram for malignant neoplasm of breast: Secondary | ICD-10-CM | POA: Diagnosis not present

## 2022-06-12 NOTE — Progress Notes (Signed)
Normal mammogram. Follow up in 1 year.

## 2022-06-16 ENCOUNTER — Ambulatory Visit: Payer: Medicaid Other

## 2022-06-16 ENCOUNTER — Other Ambulatory Visit: Payer: Self-pay

## 2022-06-16 DIAGNOSIS — Z7901 Long term (current) use of anticoagulants: Secondary | ICD-10-CM

## 2022-06-16 LAB — PROTIME-INR
INR: 1.9 — ABNORMAL HIGH
Prothrombin Time: 19 s — ABNORMAL HIGH (ref 9.0–11.5)

## 2022-06-16 NOTE — Progress Notes (Signed)
Ordered stat INR. Unable to collect during visit.

## 2022-06-16 NOTE — Progress Notes (Signed)
Can you confirm how she is taking her coumadin?

## 2022-06-17 NOTE — Progress Notes (Signed)
Increase coumadin to 3.75mg  on Thursdays and 7.5mg  all other days. Recheck in 2 weeks.

## 2022-06-30 ENCOUNTER — Ambulatory Visit (INDEPENDENT_AMBULATORY_CARE_PROVIDER_SITE_OTHER): Payer: Medicaid Other | Admitting: Physician Assistant

## 2022-06-30 VITALS — Ht 68.0 in | Wt 96.0 lb

## 2022-06-30 DIAGNOSIS — Z7901 Long term (current) use of anticoagulants: Secondary | ICD-10-CM

## 2022-06-30 DIAGNOSIS — G08 Intracranial and intraspinal phlebitis and thrombophlebitis: Secondary | ICD-10-CM

## 2022-06-30 LAB — POCT INR: INR: 2.4 (ref 2.0–3.0)

## 2022-06-30 NOTE — Progress Notes (Signed)
Pt to keep current dosages per Breeback. Recheck in 3 weeks.

## 2022-07-02 ENCOUNTER — Other Ambulatory Visit: Payer: Self-pay | Admitting: Physician Assistant

## 2022-07-02 DIAGNOSIS — G479 Sleep disorder, unspecified: Secondary | ICD-10-CM

## 2022-07-06 ENCOUNTER — Other Ambulatory Visit: Payer: Self-pay | Admitting: Physician Assistant

## 2022-07-06 DIAGNOSIS — R053 Chronic cough: Secondary | ICD-10-CM

## 2022-07-22 ENCOUNTER — Ambulatory Visit: Payer: Medicaid Other | Admitting: Physician Assistant

## 2022-07-22 VITALS — BP 116/62 | HR 70 | Ht 68.0 in

## 2022-07-22 DIAGNOSIS — Z7901 Long term (current) use of anticoagulants: Secondary | ICD-10-CM | POA: Diagnosis not present

## 2022-07-22 LAB — POCT INR: INR: 1.6 — AB (ref 2.0–3.0)

## 2022-07-22 NOTE — Patient Instructions (Signed)
Start coumadin 7.5mg  once daily  and return in 2 weeks for nurse visit for INR check.

## 2022-07-22 NOTE — Progress Notes (Signed)
INR not to goal increase to 7.5mg  every day. Recheck in 2 weeks.

## 2022-07-30 ENCOUNTER — Other Ambulatory Visit: Payer: Self-pay | Admitting: Physician Assistant

## 2022-07-30 DIAGNOSIS — R053 Chronic cough: Secondary | ICD-10-CM

## 2022-08-06 ENCOUNTER — Ambulatory Visit (INDEPENDENT_AMBULATORY_CARE_PROVIDER_SITE_OTHER): Payer: Medicaid Other | Admitting: Family Medicine

## 2022-08-06 VITALS — BP 122/70 | HR 62 | Ht 68.0 in | Wt 101.2 lb

## 2022-08-06 DIAGNOSIS — Z7901 Long term (current) use of anticoagulants: Secondary | ICD-10-CM

## 2022-08-06 LAB — POCT INR: INR: 2.7 (ref 2.0–3.0)

## 2022-08-06 NOTE — Progress Notes (Signed)
Continue  7.5mg  daily and recheck in 3 weeks.

## 2022-08-25 ENCOUNTER — Other Ambulatory Visit: Payer: Self-pay | Admitting: Physician Assistant

## 2022-08-25 ENCOUNTER — Ambulatory Visit: Payer: Medicaid Other

## 2022-08-25 VITALS — BP 105/61 | HR 71

## 2022-08-25 DIAGNOSIS — Z7901 Long term (current) use of anticoagulants: Secondary | ICD-10-CM | POA: Diagnosis not present

## 2022-08-25 DIAGNOSIS — R053 Chronic cough: Secondary | ICD-10-CM

## 2022-08-25 LAB — POCT INR: INR: 2.3 (ref 2.0–3.0)

## 2022-08-25 NOTE — Progress Notes (Signed)
Continue same dose and recheck in 3 weeks

## 2022-09-15 ENCOUNTER — Ambulatory Visit: Payer: Medicaid Other | Admitting: Physician Assistant

## 2022-09-15 VITALS — BP 110/53 | HR 72 | Ht 68.0 in

## 2022-09-15 DIAGNOSIS — Z7901 Long term (current) use of anticoagulants: Secondary | ICD-10-CM

## 2022-09-15 DIAGNOSIS — Z23 Encounter for immunization: Secondary | ICD-10-CM | POA: Diagnosis not present

## 2022-09-15 LAB — POCT INR: INR: 2.2 (ref 2.0–3.0)

## 2022-09-15 MED ORDER — WARFARIN SODIUM 7.5 MG PO TABS
ORAL_TABLET | ORAL | 1 refills | Status: DC
Start: 2022-09-15 — End: 2023-03-16

## 2022-09-15 NOTE — Progress Notes (Signed)
Stable INR. Continue same 7.5mg  dose. Recheck in 4 weeks.

## 2022-09-19 ENCOUNTER — Other Ambulatory Visit: Payer: Self-pay | Admitting: Physician Assistant

## 2022-09-19 DIAGNOSIS — S92424A Nondisplaced fracture of distal phalanx of right great toe, initial encounter for closed fracture: Secondary | ICD-10-CM | POA: Diagnosis not present

## 2022-09-19 DIAGNOSIS — M79674 Pain in right toe(s): Secondary | ICD-10-CM | POA: Diagnosis not present

## 2022-09-19 DIAGNOSIS — W1830XA Fall on same level, unspecified, initial encounter: Secondary | ICD-10-CM | POA: Diagnosis not present

## 2022-09-19 DIAGNOSIS — M79604 Pain in right leg: Secondary | ICD-10-CM | POA: Diagnosis not present

## 2022-09-19 DIAGNOSIS — M25561 Pain in right knee: Secondary | ICD-10-CM | POA: Diagnosis not present

## 2022-09-19 DIAGNOSIS — R053 Chronic cough: Secondary | ICD-10-CM

## 2022-09-22 ENCOUNTER — Other Ambulatory Visit: Payer: Self-pay | Admitting: Physician Assistant

## 2022-09-22 DIAGNOSIS — F411 Generalized anxiety disorder: Secondary | ICD-10-CM

## 2022-09-22 DIAGNOSIS — F331 Major depressive disorder, recurrent, moderate: Secondary | ICD-10-CM

## 2022-09-23 ENCOUNTER — Encounter: Payer: Self-pay | Admitting: Physician Assistant

## 2022-09-24 ENCOUNTER — Other Ambulatory Visit: Payer: Self-pay | Admitting: Physician Assistant

## 2022-09-24 DIAGNOSIS — G44221 Chronic tension-type headache, intractable: Secondary | ICD-10-CM

## 2022-09-24 DIAGNOSIS — M5030 Other cervical disc degeneration, unspecified cervical region: Secondary | ICD-10-CM

## 2022-10-14 ENCOUNTER — Other Ambulatory Visit: Payer: Self-pay | Admitting: Physician Assistant

## 2022-10-14 DIAGNOSIS — G479 Sleep disorder, unspecified: Secondary | ICD-10-CM

## 2022-10-14 DIAGNOSIS — G44221 Chronic tension-type headache, intractable: Secondary | ICD-10-CM

## 2022-10-19 ENCOUNTER — Ambulatory Visit (INDEPENDENT_AMBULATORY_CARE_PROVIDER_SITE_OTHER): Payer: Medicaid Other | Admitting: Physician Assistant

## 2022-10-19 DIAGNOSIS — Z7901 Long term (current) use of anticoagulants: Secondary | ICD-10-CM | POA: Diagnosis not present

## 2022-10-19 LAB — POCT INR: INR: 2.6 (ref 2.0–3.0)

## 2022-10-19 NOTE — Progress Notes (Signed)
   Established Patient Office Visit  Subjective   Patient ID: Valerie Pratt, female    DOB: 08-Dec-1965  Age: 57 y.o. MRN: [id number]  No chief complaint on file.   HPI  Anticoagulation management. Coumadin check. Patient denies any unusual bleeding. Patient states she is taking 7.5mg  coumadin daily.   ROS    Objective:     There were no vitals taken for this visit.   Physical Exam   Results for orders placed or performed in visit on 10/19/22  POCT INR  Result Value Ref Range   INR 2.6 2.0 - 3.0      The ASCVD Risk score (Arnett DK, et al., 2019) failed to calculate for the following reasons:   The patient has a prior MI or stroke diagnosis    Assessment & Plan:  INR = 2.6. Per Tandy Gaw, continue current medication regimen and return in 4 weeks for nurse visit for coumadin clinic INR check.  Problem List Items Addressed This Visit       Other   Anticoagulated - Primary    No follow-ups on file.    Elizabeth Palau, LPN

## 2022-10-22 ENCOUNTER — Other Ambulatory Visit: Payer: Self-pay | Admitting: Physician Assistant

## 2022-10-22 DIAGNOSIS — R053 Chronic cough: Secondary | ICD-10-CM

## 2022-10-30 ENCOUNTER — Other Ambulatory Visit: Payer: Self-pay | Admitting: Physician Assistant

## 2022-10-30 DIAGNOSIS — M5030 Other cervical disc degeneration, unspecified cervical region: Secondary | ICD-10-CM

## 2022-10-30 DIAGNOSIS — G44221 Chronic tension-type headache, intractable: Secondary | ICD-10-CM

## 2022-11-13 ENCOUNTER — Other Ambulatory Visit: Payer: Self-pay | Admitting: Physician Assistant

## 2022-11-13 DIAGNOSIS — R053 Chronic cough: Secondary | ICD-10-CM

## 2022-12-07 ENCOUNTER — Other Ambulatory Visit: Payer: Self-pay | Admitting: Physician Assistant

## 2022-12-07 DIAGNOSIS — R053 Chronic cough: Secondary | ICD-10-CM

## 2022-12-09 ENCOUNTER — Ambulatory Visit: Payer: Medicaid Other | Admitting: Physician Assistant

## 2022-12-09 ENCOUNTER — Ambulatory Visit (INDEPENDENT_AMBULATORY_CARE_PROVIDER_SITE_OTHER): Payer: Medicaid Other | Admitting: Physician Assistant

## 2022-12-09 VITALS — BP 109/62 | HR 75

## 2022-12-09 DIAGNOSIS — Z7901 Long term (current) use of anticoagulants: Secondary | ICD-10-CM

## 2022-12-09 LAB — POCT INR: INR: 2.5 (ref 2.0–3.0)

## 2022-12-09 NOTE — Progress Notes (Signed)
INR stable.same dose recheck in 8 weeks.

## 2022-12-26 ENCOUNTER — Other Ambulatory Visit: Payer: Self-pay | Admitting: Physician Assistant

## 2022-12-26 DIAGNOSIS — F331 Major depressive disorder, recurrent, moderate: Secondary | ICD-10-CM

## 2022-12-26 DIAGNOSIS — F411 Generalized anxiety disorder: Secondary | ICD-10-CM

## 2023-01-05 ENCOUNTER — Other Ambulatory Visit: Payer: Self-pay | Admitting: Physician Assistant

## 2023-01-05 DIAGNOSIS — R053 Chronic cough: Secondary | ICD-10-CM

## 2023-01-07 ENCOUNTER — Ambulatory Visit: Payer: Medicaid Other

## 2023-01-11 ENCOUNTER — Other Ambulatory Visit: Payer: Self-pay | Admitting: Physician Assistant

## 2023-01-11 DIAGNOSIS — G479 Sleep disorder, unspecified: Secondary | ICD-10-CM

## 2023-02-01 ENCOUNTER — Ambulatory Visit (INDEPENDENT_AMBULATORY_CARE_PROVIDER_SITE_OTHER): Payer: Medicaid Other | Admitting: Physician Assistant

## 2023-02-01 ENCOUNTER — Other Ambulatory Visit: Payer: Self-pay | Admitting: Physician Assistant

## 2023-02-01 VITALS — BP 125/68 | HR 98

## 2023-02-01 DIAGNOSIS — Z7901 Long term (current) use of anticoagulants: Secondary | ICD-10-CM

## 2023-02-01 DIAGNOSIS — G08 Intracranial and intraspinal phlebitis and thrombophlebitis: Secondary | ICD-10-CM

## 2023-02-01 DIAGNOSIS — R053 Chronic cough: Secondary | ICD-10-CM

## 2023-02-01 LAB — POCT INR: INR: 2.1 (ref 2.0–3.0)

## 2023-02-01 NOTE — Progress Notes (Signed)
Continue same dose and recheck in 8 weeks.

## 2023-02-01 NOTE — Patient Instructions (Signed)
Continue same dose and recheck in 8 weeks.

## 2023-02-03 NOTE — Progress Notes (Signed)
Patient advised.

## 2023-02-24 DIAGNOSIS — E1065 Type 1 diabetes mellitus with hyperglycemia: Secondary | ICD-10-CM | POA: Diagnosis not present

## 2023-02-26 ENCOUNTER — Other Ambulatory Visit: Payer: Self-pay | Admitting: Physician Assistant

## 2023-02-26 DIAGNOSIS — G479 Sleep disorder, unspecified: Secondary | ICD-10-CM

## 2023-03-03 ENCOUNTER — Other Ambulatory Visit: Payer: Self-pay | Admitting: Physician Assistant

## 2023-03-03 DIAGNOSIS — R053 Chronic cough: Secondary | ICD-10-CM

## 2023-03-15 ENCOUNTER — Other Ambulatory Visit: Payer: Self-pay | Admitting: Physician Assistant

## 2023-03-15 DIAGNOSIS — Z7901 Long term (current) use of anticoagulants: Secondary | ICD-10-CM

## 2023-03-28 ENCOUNTER — Other Ambulatory Visit: Payer: Self-pay | Admitting: Physician Assistant

## 2023-03-28 DIAGNOSIS — R053 Chronic cough: Secondary | ICD-10-CM

## 2023-03-29 ENCOUNTER — Other Ambulatory Visit: Payer: Self-pay | Admitting: Physician Assistant

## 2023-03-29 ENCOUNTER — Ambulatory Visit (INDEPENDENT_AMBULATORY_CARE_PROVIDER_SITE_OTHER): Payer: Medicaid Other | Admitting: Physician Assistant

## 2023-03-29 VITALS — BP 102/57 | HR 73 | Ht 68.0 in

## 2023-03-29 DIAGNOSIS — Z7901 Long term (current) use of anticoagulants: Secondary | ICD-10-CM | POA: Diagnosis not present

## 2023-03-29 DIAGNOSIS — F411 Generalized anxiety disorder: Secondary | ICD-10-CM

## 2023-03-29 DIAGNOSIS — F331 Major depressive disorder, recurrent, moderate: Secondary | ICD-10-CM

## 2023-03-29 LAB — POCT INR: INR: 2.7 (ref 2.0–3.0)

## 2023-03-30 NOTE — Progress Notes (Signed)
 Continue  7.5mg  daily and recheck in 8 weeks.

## 2023-04-17 ENCOUNTER — Other Ambulatory Visit: Payer: Self-pay | Admitting: Physician Assistant

## 2023-04-17 DIAGNOSIS — G479 Sleep disorder, unspecified: Secondary | ICD-10-CM

## 2023-04-21 ENCOUNTER — Other Ambulatory Visit: Payer: Self-pay | Admitting: Physician Assistant

## 2023-04-21 DIAGNOSIS — R053 Chronic cough: Secondary | ICD-10-CM

## 2023-04-30 ENCOUNTER — Other Ambulatory Visit: Payer: Self-pay | Admitting: Physician Assistant

## 2023-04-30 DIAGNOSIS — F331 Major depressive disorder, recurrent, moderate: Secondary | ICD-10-CM

## 2023-04-30 DIAGNOSIS — F411 Generalized anxiety disorder: Secondary | ICD-10-CM

## 2023-05-05 ENCOUNTER — Other Ambulatory Visit: Payer: Self-pay | Admitting: Physician Assistant

## 2023-05-05 DIAGNOSIS — G44221 Chronic tension-type headache, intractable: Secondary | ICD-10-CM

## 2023-05-05 DIAGNOSIS — M5030 Other cervical disc degeneration, unspecified cervical region: Secondary | ICD-10-CM

## 2023-05-14 ENCOUNTER — Other Ambulatory Visit: Payer: Self-pay | Admitting: Physician Assistant

## 2023-05-14 DIAGNOSIS — R053 Chronic cough: Secondary | ICD-10-CM

## 2023-05-25 DIAGNOSIS — R634 Abnormal weight loss: Secondary | ICD-10-CM | POA: Diagnosis not present

## 2023-05-25 DIAGNOSIS — E1065 Type 1 diabetes mellitus with hyperglycemia: Secondary | ICD-10-CM | POA: Diagnosis not present

## 2023-06-03 ENCOUNTER — Other Ambulatory Visit: Payer: Self-pay | Admitting: Physician Assistant

## 2023-06-03 ENCOUNTER — Ambulatory Visit (INDEPENDENT_AMBULATORY_CARE_PROVIDER_SITE_OTHER)

## 2023-06-03 VITALS — BP 104/71 | HR 83 | Temp 98.6°F

## 2023-06-03 DIAGNOSIS — Z7901 Long term (current) use of anticoagulants: Secondary | ICD-10-CM | POA: Diagnosis not present

## 2023-06-03 DIAGNOSIS — G479 Sleep disorder, unspecified: Secondary | ICD-10-CM

## 2023-06-03 LAB — POCT INR: INR: 3.2 — AB (ref 2.0–3.0)

## 2023-06-03 NOTE — Progress Notes (Signed)
 Patient does not report any abnormal bleeding or issues with coumadin  use. She currently reports taking coumadin  7.5mg   one tablet daily .

## 2023-06-04 ENCOUNTER — Ambulatory Visit: Payer: Self-pay | Admitting: Physician Assistant

## 2023-06-04 NOTE — Progress Notes (Signed)
 INR just out of range of 2-3. Stay on same dose and recheck in 2 weeks. If not back into normal range will make dose change. Have you been on any antibiotics, new supplements, new diet changes?

## 2023-06-10 ENCOUNTER — Other Ambulatory Visit: Payer: Self-pay | Admitting: Physician Assistant

## 2023-06-10 DIAGNOSIS — Z7901 Long term (current) use of anticoagulants: Secondary | ICD-10-CM

## 2023-06-12 ENCOUNTER — Other Ambulatory Visit: Payer: Self-pay | Admitting: Physician Assistant

## 2023-06-12 DIAGNOSIS — R053 Chronic cough: Secondary | ICD-10-CM

## 2023-06-17 ENCOUNTER — Ambulatory Visit

## 2023-06-17 ENCOUNTER — Ambulatory Visit: Payer: Self-pay

## 2023-06-17 VITALS — BP 110/61 | HR 67 | Resp 20 | Ht 68.0 in | Wt 97.8 lb

## 2023-06-17 DIAGNOSIS — Z7901 Long term (current) use of anticoagulants: Secondary | ICD-10-CM | POA: Diagnosis not present

## 2023-06-17 LAB — POCT INR: INR: 2.3 (ref 2.0–3.0)

## 2023-06-17 NOTE — Progress Notes (Signed)
 Patient here for an INR re-check from 2 weeks since her last INR was 3.2.  Today here INR is 2.3.  Patient was advised that we will contact her to let her know if she needs to come back in 4 or 6 weeks to re-check her INR.

## 2023-07-18 ENCOUNTER — Other Ambulatory Visit: Payer: Self-pay | Admitting: Physician Assistant

## 2023-07-18 DIAGNOSIS — G479 Sleep disorder, unspecified: Secondary | ICD-10-CM

## 2023-07-28 ENCOUNTER — Other Ambulatory Visit: Payer: Self-pay | Admitting: Physician Assistant

## 2023-07-28 DIAGNOSIS — R053 Chronic cough: Secondary | ICD-10-CM

## 2023-08-20 ENCOUNTER — Other Ambulatory Visit: Payer: Self-pay | Admitting: Physician Assistant

## 2023-08-20 DIAGNOSIS — R053 Chronic cough: Secondary | ICD-10-CM

## 2023-08-23 ENCOUNTER — Ambulatory Visit (INDEPENDENT_AMBULATORY_CARE_PROVIDER_SITE_OTHER)

## 2023-08-23 VITALS — BP 106/59 | HR 67 | Ht 68.0 in

## 2023-08-23 DIAGNOSIS — Z7901 Long term (current) use of anticoagulants: Secondary | ICD-10-CM | POA: Diagnosis not present

## 2023-08-23 LAB — POCT INR: INR: 4.1 — AB (ref 2.0–3.0)

## 2023-08-23 NOTE — Progress Notes (Signed)
 INR is much higher than normal and patient was unsure if she took dose last night and this morning. She will go home and check. If she did not will make decrease in dose and recheck in 1 week. If she had go back to regular once day dosing and recheck in 1 week.   She is supposed to be on 7.5mg  daily.

## 2023-08-24 ENCOUNTER — Encounter: Payer: Self-pay | Admitting: Physician Assistant

## 2023-08-25 DIAGNOSIS — E1065 Type 1 diabetes mellitus with hyperglycemia: Secondary | ICD-10-CM | POA: Diagnosis not present

## 2023-08-25 LAB — HEMOGLOBIN A1C: Hemoglobin A1C: 7.9

## 2023-08-30 ENCOUNTER — Ambulatory Visit (INDEPENDENT_AMBULATORY_CARE_PROVIDER_SITE_OTHER): Admitting: Physician Assistant

## 2023-08-30 VITALS — BP 121/62 | HR 74 | Ht 68.0 in | Wt 99.0 lb

## 2023-08-30 DIAGNOSIS — Z7901 Long term (current) use of anticoagulants: Secondary | ICD-10-CM

## 2023-08-30 LAB — POCT INR: INR: 2 (ref 2.0–3.0)

## 2023-09-07 ENCOUNTER — Other Ambulatory Visit: Payer: Self-pay | Admitting: Physician Assistant

## 2023-09-07 DIAGNOSIS — G479 Sleep disorder, unspecified: Secondary | ICD-10-CM

## 2023-09-10 ENCOUNTER — Other Ambulatory Visit: Payer: Self-pay | Admitting: Physician Assistant

## 2023-09-10 DIAGNOSIS — Z7901 Long term (current) use of anticoagulants: Secondary | ICD-10-CM

## 2023-09-15 ENCOUNTER — Other Ambulatory Visit: Payer: Self-pay | Admitting: Physician Assistant

## 2023-09-15 DIAGNOSIS — F411 Generalized anxiety disorder: Secondary | ICD-10-CM

## 2023-09-15 DIAGNOSIS — F331 Major depressive disorder, recurrent, moderate: Secondary | ICD-10-CM

## 2023-09-21 ENCOUNTER — Ambulatory Visit (INDEPENDENT_AMBULATORY_CARE_PROVIDER_SITE_OTHER)

## 2023-09-21 VITALS — BP 116/67 | HR 66 | Ht 68.0 in | Wt 100.0 lb

## 2023-09-21 DIAGNOSIS — Z7901 Long term (current) use of anticoagulants: Secondary | ICD-10-CM | POA: Diagnosis not present

## 2023-09-21 LAB — POCT INR: INR: 3.4 — AB (ref 2.0–3.0)

## 2023-09-21 NOTE — Progress Notes (Signed)
 Patient is in office today for a nurse visit for INR Check. Patient denies any changes in medications or diet. No bleeding or bruising.   She reports taking 7.5mg  daily in the evenings.

## 2023-09-21 NOTE — Progress Notes (Signed)
 Spoke with patient, she is aware of the new directions for her Coumadin . 7.5mg  every day except Saturday when she is to take half a tablet. Patient repeated the instructions back. Patient verbalized the need to have INR rechecked in 2 weeks.

## 2023-10-09 IMAGING — CT CT HEAD W/O CM
4 of 5 series · 16 of 47 positions shown, 18 images · non-contrast
Comparison: None.

CLINICAL DATA: Headache, chronic,

EXAM:
CT HEAD WITHOUT CONTRAST
TECHNIQUE: Contiguous axial images were obtained from the base of the skull
through the vertex without intravenous contrast.

[Series 3: head wo · axial · 0.41mm/px · z∈[-137,-97]mm · 3 of 31 slices shown]
[im 5/31  brain]
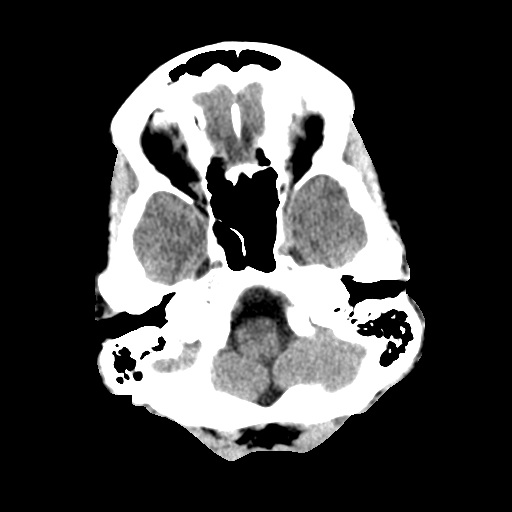
[im 9/31  brain]
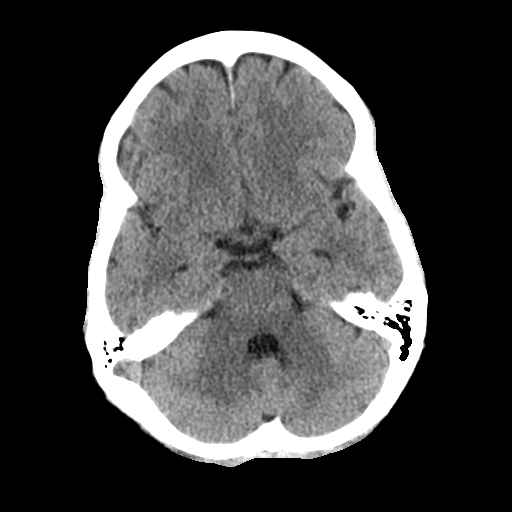
[im 13/31  brain]
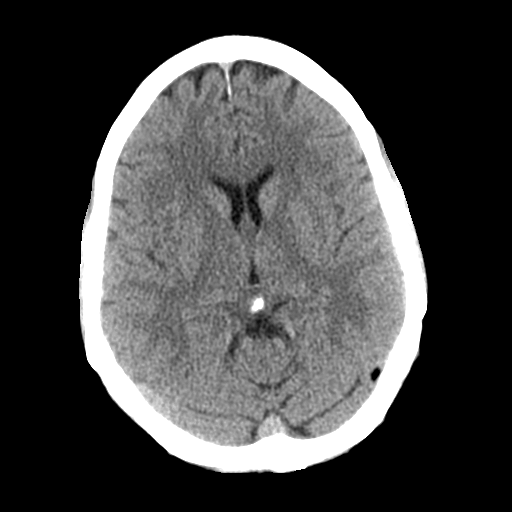

[Series 4: head wo coronal · coronal · 0.30mm/px · 3 of 66 slices shown]
[im 22/66  brain]
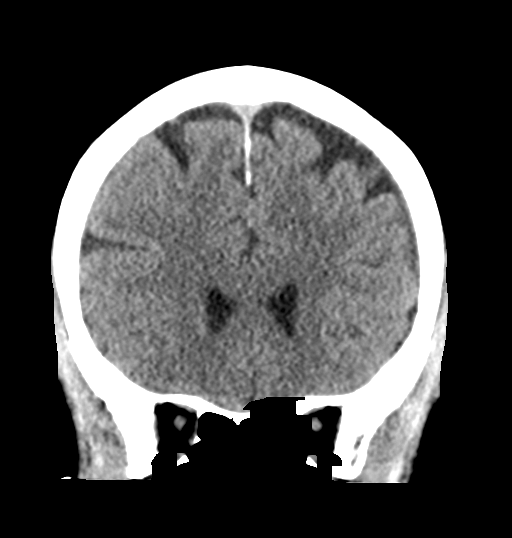
[im 29/66  brain]
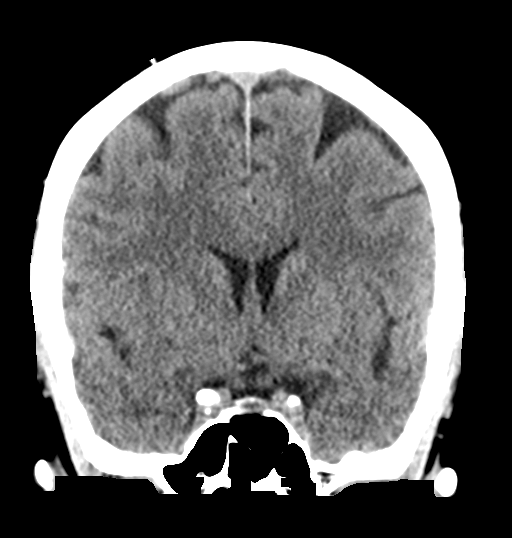
[im 37/66  brain]
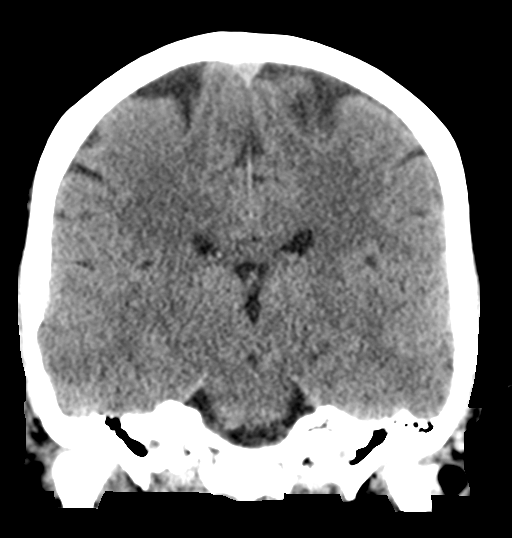

[Series 5: head wo sagittal · sagittal · 0.33mm/px · 3 of 58 slices shown]
[im 20/58  brain]
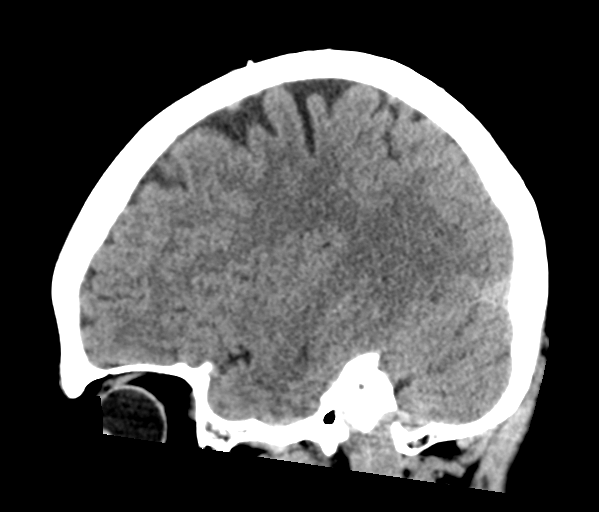
[im 29/58  brain]
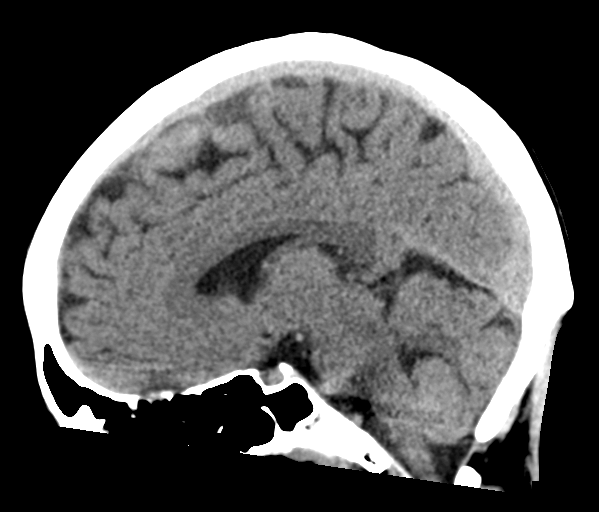
[im 39/58  brain]
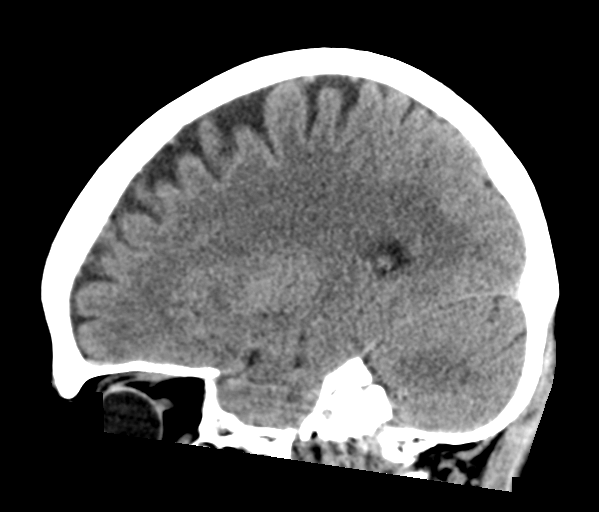

[Series 6: ax head wo · axial · 0.35mm/px · z∈[-192,-71]mm · 7 of 35 slices shown, 9 images]
[im 5/35  brain]
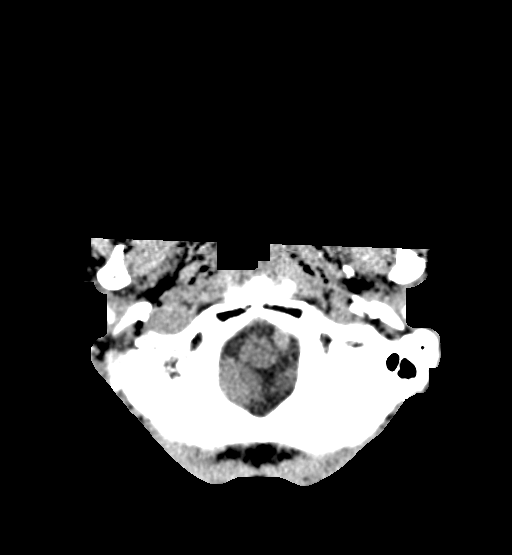
[im 5/35  bone]
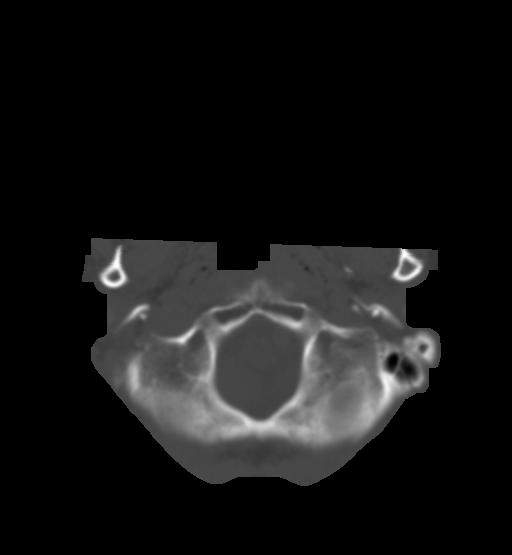
[im 9/35  brain]
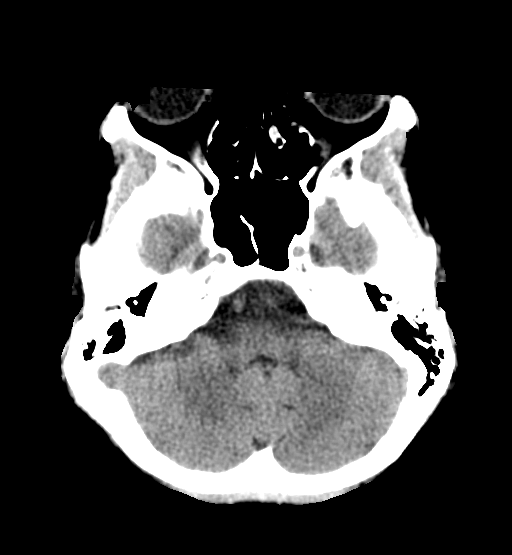
[im 13/35  brain]
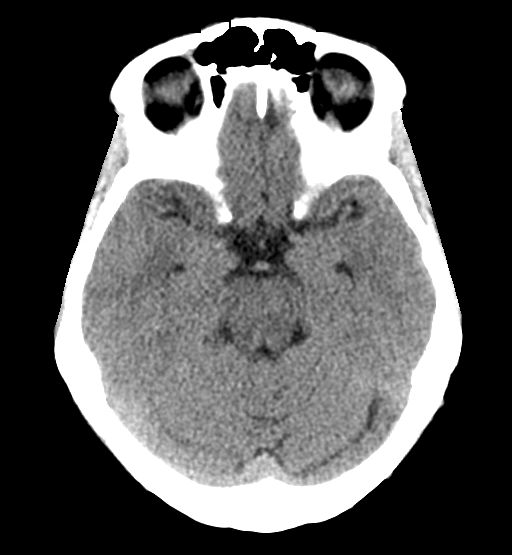
[im 18/35  brain]
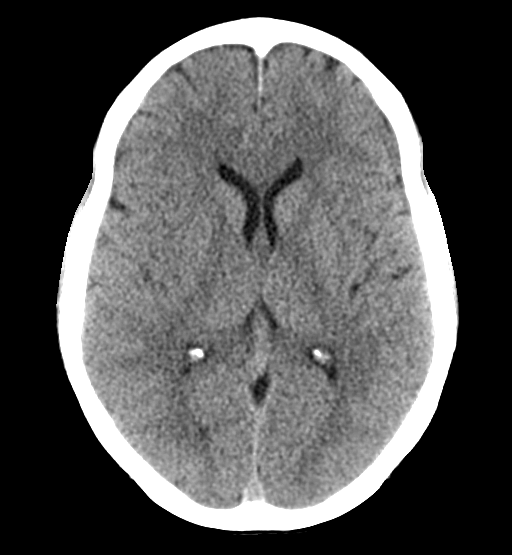
[im 22/35  brain]
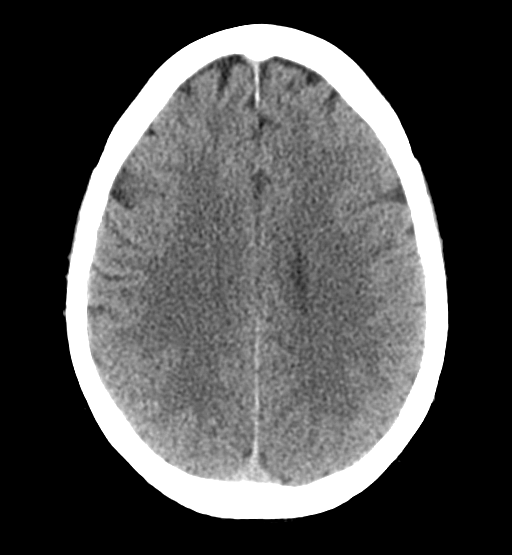
[im 22/35  bone]
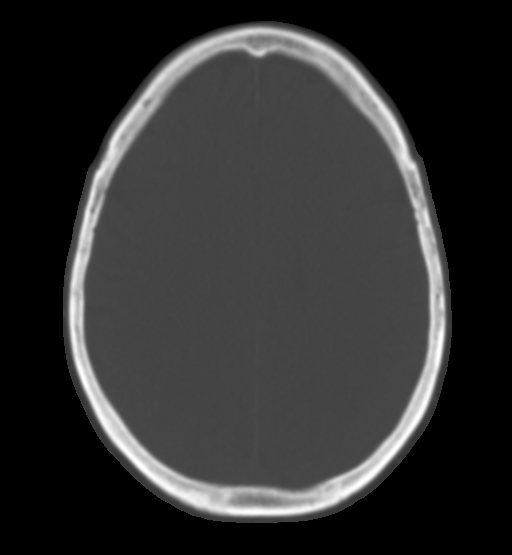
[im 26/35  brain]
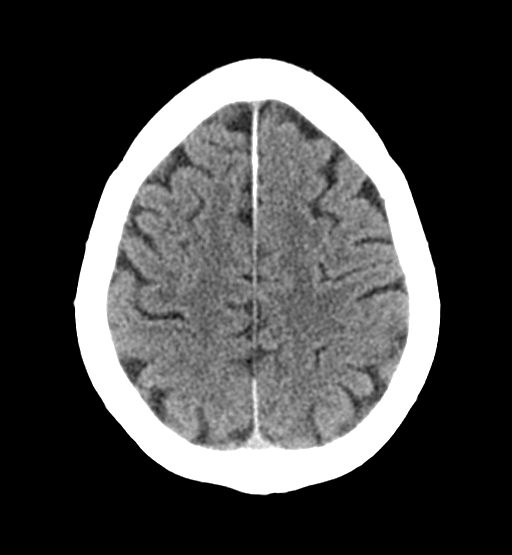
[im 30/35  brain]
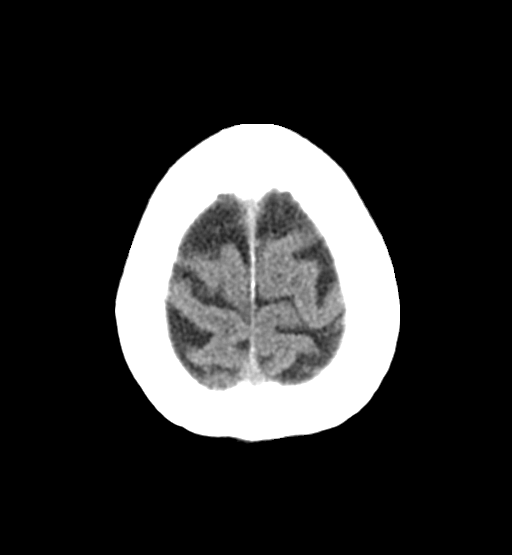

[16 of 47 positions shown; findings below may reference images not displayed]

FINDINGS: Brain: No evidence of acute infarction, hemorrhage, cerebral edema,
mass, mass effect, or midline shift. No hydrocephalus or extra-axial
fluid collection.

Vascular: No hyperdense vessel.

Skull: Normal. Negative for fracture or focal lesion.

Sinuses/Orbits: No acute finding.

Other: The mastoid air cells are well aerated.
IMPRESSION: IMPRESSION
No acute intracranial process. No etiology is seen for the patient's
headache.

## 2023-10-10 ENCOUNTER — Other Ambulatory Visit: Payer: Self-pay | Admitting: Physician Assistant

## 2023-10-10 DIAGNOSIS — Z7901 Long term (current) use of anticoagulants: Secondary | ICD-10-CM

## 2023-10-13 ENCOUNTER — Ambulatory Visit

## 2023-10-13 VITALS — BP 111/67 | HR 71 | Resp 18 | Ht 68.0 in

## 2023-10-13 DIAGNOSIS — Z7901 Long term (current) use of anticoagulants: Secondary | ICD-10-CM

## 2023-10-13 LAB — POCT INR: INR: 3 (ref 2.0–3.0)

## 2023-10-13 NOTE — Patient Instructions (Addendum)
 Spoke with patient, she is aware she is to continue to take 7.5mg  every day. Patient repeated the instructions back. Patient verbalized the need to have INR rechecked in 4 weeks. Around 11/10/23

## 2023-10-15 NOTE — Progress Notes (Signed)
 Stay on 7.5mg  daily as patient reports to be taking. Recheck in 4 weeks.

## 2023-10-23 ENCOUNTER — Other Ambulatory Visit: Payer: Self-pay | Admitting: Physician Assistant

## 2023-10-23 DIAGNOSIS — R053 Chronic cough: Secondary | ICD-10-CM

## 2023-10-25 ENCOUNTER — Other Ambulatory Visit: Payer: Self-pay | Admitting: Physician Assistant

## 2023-10-25 DIAGNOSIS — G479 Sleep disorder, unspecified: Secondary | ICD-10-CM

## 2023-11-07 ENCOUNTER — Other Ambulatory Visit: Payer: Self-pay | Admitting: Physician Assistant

## 2023-11-07 DIAGNOSIS — Z7901 Long term (current) use of anticoagulants: Secondary | ICD-10-CM

## 2023-11-10 ENCOUNTER — Ambulatory Visit

## 2023-11-10 ENCOUNTER — Ambulatory Visit: Admitting: Physician Assistant

## 2023-11-10 VITALS — BP 111/70 | HR 70 | Ht 67.0 in | Wt 98.8 lb

## 2023-11-10 DIAGNOSIS — F330 Major depressive disorder, recurrent, mild: Secondary | ICD-10-CM | POA: Diagnosis not present

## 2023-11-10 DIAGNOSIS — G44221 Chronic tension-type headache, intractable: Secondary | ICD-10-CM

## 2023-11-10 DIAGNOSIS — M5030 Other cervical disc degeneration, unspecified cervical region: Secondary | ICD-10-CM | POA: Diagnosis not present

## 2023-11-10 DIAGNOSIS — F411 Generalized anxiety disorder: Secondary | ICD-10-CM | POA: Diagnosis not present

## 2023-11-10 DIAGNOSIS — Z13220 Encounter for screening for lipoid disorders: Secondary | ICD-10-CM | POA: Diagnosis not present

## 2023-11-10 DIAGNOSIS — Z131 Encounter for screening for diabetes mellitus: Secondary | ICD-10-CM

## 2023-11-10 DIAGNOSIS — Z7901 Long term (current) use of anticoagulants: Secondary | ICD-10-CM | POA: Diagnosis not present

## 2023-11-10 DIAGNOSIS — Z0000 Encounter for general adult medical examination without abnormal findings: Secondary | ICD-10-CM | POA: Diagnosis not present

## 2023-11-10 DIAGNOSIS — G08 Intracranial and intraspinal phlebitis and thrombophlebitis: Secondary | ICD-10-CM

## 2023-11-10 DIAGNOSIS — E785 Hyperlipidemia, unspecified: Secondary | ICD-10-CM | POA: Diagnosis not present

## 2023-11-10 MED ORDER — SERTRALINE HCL 100 MG PO TABS: 200.0000 mg | ORAL_TABLET | Freq: Every day | ORAL | 3 refills | Status: AC

## 2023-11-10 MED ORDER — METAXALONE 800 MG PO TABS: 800.0000 mg | ORAL_TABLET | Freq: Three times a day (TID) | ORAL | 0 refills | Status: AC

## 2023-11-10 NOTE — Progress Notes (Signed)
 Established Patient Office Visit  Subjective   Patient ID: Valerie Pratt, female    DOB: 06-Jun-1965  Age: 58 y.o. MRN: [id number]   HPI .SABRADiscussed the use of AI scribe software for clinical note transcription with the patient, who gave verbal consent to proceed.  History of Present Illness Valerie Pratt is a 58 year old female who presents for medication review and routine follow-up.  Diabetes mellitus management - Managing diabetes with Levemir FlexPen and NovoLog  for the past four months - Follows up with endocrinology for diabetes care - Last A1c was 7.9 in August 2025.   Chronic pain and headaches - Uses pregabalin  (Lyrica ) for chronic pain - Takes baclofen  once daily for back pain and headaches, which is somewhat effective but would be willing to try another muscle relaxer - Previous trials with Flexeril and Robaxin were ineffective - Baclofen  does not cause drowsiness  Hyperlipidemia management - On pravastatin  for cholesterol management  Anticoagulation therapy - Uses Coumadin  for anticoagulation  Sleep disturbance - Uses trazodone  for sleep  Depressive symptoms - On sertraline , taking one and a half of the 100mg  tablets - she would like to increase to 2 tablets daily.   General symptoms and activity level - No swelling, chest pain, or shortness of breath - Remains active, primarily through activities with her children     ROS See HPI.    Objective:     BP 111/70   Pulse 70   Ht 5' 7 (1.702 m)   Wt 98 lb 12.8 oz (44.8 kg)   SpO2 99%   BMI 15.47 kg/m   .SABRA    11/10/2023   10:58 AM 11/10/2023   10:57 AM 03/31/2022   11:19 AM 09/15/2021    9:16 AM 12/04/2020    8:52 AM  Depression screen PHQ 2/9  Decreased Interest 0 0 0 0 1  Down, Depressed, Hopeless 0  1 1 0  PHQ - 2 Score 0 0 1 1 1   Altered sleeping 0  2  2  Tired, decreased energy 0  1  1  Change in appetite 0  0  0  Feeling bad or failure about yourself  0  0  0  Trouble  concentrating 0  2  1  Moving slowly or fidgety/restless 0  3  0  Suicidal thoughts 0  0  0  PHQ-9 Score 0   9   5   Difficult doing work/chores   Somewhat difficult  Not difficult at all     Data saved with a previous flowsheet row definition   .SABRA    11/10/2023   10:58 AM 03/31/2022   11:19 AM 12/04/2020    8:53 AM 04/16/2020   10:22 AM  GAD 7 : Generalized Anxiety Score  Nervous, Anxious, on Edge 0 1 1 1   Control/stop worrying 0 1 1 1   Worry too much - different things 0 1 1 1   Trouble relaxing 0 2 2 1   Restless 0 2 1 1   Easily annoyed or irritable 0 1 1 1   Afraid - awful might happen 0 0 0 0  Total GAD 7 Score 0 8 7 6   Anxiety Difficulty Not difficult at all Somewhat difficult Somewhat difficult Somewhat difficult     Physical Exam Constitutional:      Appearance: Normal appearance.  HENT:     Head: Normocephalic.  Cardiovascular:     Rate and Rhythm: Normal rate and regular rhythm.  Pulmonary:     Effort:  Pulmonary effort is normal.     Breath sounds: Normal breath sounds.  Musculoskeletal:     Right lower leg: No edema.     Left lower leg: No edema.  Neurological:     General: No focal deficit present.     Mental Status: She is alert and oriented to person, place, and time.  Psychiatric:        Mood and Affect: Mood normal.        Assessment & Plan:  SABRASABRAOyindamola was seen today for medical management of chronic issues.  Diagnoses and all orders for this visit:  Anticoagulated -     Protime-INR  Screening for diabetes mellitus -     CMP14+EGFR  Screening for lipid disorders -     Lipid panel  Dyslipidemia, goal LDL below 70  DDD (degenerative disc disease), cervical  Chronic tension-type headache, intractable -     metaxalone (SKELAXIN) 800 MG tablet; Take 1 tablet (800 mg total) by mouth 3 (three) times daily.  Preventative health care -     CMP14+EGFR -     Protime-INR -     Lipid panel -     TSH + free T4 -     CBC w/Diff/Platelet -      VITAMIN D 25 Hydroxy (Vit-D Deficiency, Fractures) -     B12 and Folate Panel  GAD (generalized anxiety disorder) -     sertraline  (ZOLOFT ) 100 MG tablet; Take 2 tablets (200 mg total) by mouth daily.    Assessment & Plan Anticoagulation - PT/INR ordered - currently 7.5mg  daily  Type 2 diabetes mellitus Managed with Levemir FlexPen and NovoLog . - Continue Levemir FlexPen and NovoLog  as prescribed. - managed by endocrinology  Hyperlipidemia Managed with pravastatin . - Continue pravastatin  as prescribed. - lipid level ordered today  Chronic HAs due to neck pain and tension Previous trials of Flexeril and Robaxin ineffective. - Continue pregabalin  as prescribed. - Prescribed skelaxin to replace baclofen  for next 30 days to see if any improvement.   Insomnia - Continue trazodone  as prescribed.  MDD/GAD PHQ/GAD looks good.  - Prescribed sertraline  200 mg to accommodate patients request. - NO side effects.   General Health Maintenance Declined flu and COVID vaccinations. Declined Cologuard screening. No recent eye examination. - Encouraged scheduling an eye examination.     Return in about 6 months (around 05/09/2024).    Mattison Stuckey, PA-C

## 2023-11-11 LAB — CBC WITH DIFFERENTIAL/PLATELET
Basophils Absolute: 0 x10E3/uL (ref 0.0–0.2)
Basos: 1 %
EOS (ABSOLUTE): 0 x10E3/uL (ref 0.0–0.4)
Eos: 1 %
Hematocrit: 39 % (ref 34.0–46.6)
Hemoglobin: 12.2 g/dL (ref 11.1–15.9)
Immature Grans (Abs): 0 x10E3/uL (ref 0.0–0.1)
Immature Granulocytes: 0 %
Lymphocytes Absolute: 1.3 x10E3/uL (ref 0.7–3.1)
Lymphs: 20 %
MCH: 28.4 pg (ref 26.6–33.0)
MCHC: 31.3 g/dL — ABNORMAL LOW (ref 31.5–35.7)
MCV: 91 fL (ref 79–97)
Monocytes Absolute: 0.5 x10E3/uL (ref 0.1–0.9)
Monocytes: 8 %
Neutrophils Absolute: 4.5 x10E3/uL (ref 1.4–7.0)
Neutrophils: 70 %
Platelets: 211 x10E3/uL (ref 150–450)
RBC: 4.29 x10E6/uL (ref 3.77–5.28)
RDW: 13.2 % (ref 11.7–15.4)
WBC: 6.4 x10E3/uL (ref 3.4–10.8)

## 2023-11-11 LAB — CMP14+EGFR
ALT: 15 IU/L (ref 0–32)
AST: 24 IU/L (ref 0–40)
Albumin: 4.4 g/dL (ref 3.8–4.9)
Alkaline Phosphatase: 69 IU/L (ref 49–135)
BUN/Creatinine Ratio: 31 — ABNORMAL HIGH (ref 12–28)
BUN: 15 mg/dL (ref 8–27)
Bilirubin Total: 0.4 mg/dL (ref 0.0–1.2)
CO2: 21 mmol/L (ref 20–29)
Calcium: 9.1 mg/dL (ref 8.7–10.3)
Chloride: 104 mmol/L (ref 96–106)
Creatinine, Ser: 0.49 mg/dL — ABNORMAL LOW (ref 0.57–1.00)
Globulin, Total: 2.5 g/dL (ref 1.5–4.5)
Glucose: 158 mg/dL — ABNORMAL HIGH (ref 70–99)
Potassium: 3.9 mmol/L (ref 3.5–5.2)
Sodium: 145 mmol/L — ABNORMAL HIGH (ref 134–144)
Total Protein: 6.9 g/dL (ref 6.0–8.5)
eGFR: 108 mL/min/1.73 (ref >59)

## 2023-11-11 LAB — PROTIME-INR
INR: 4.2 — ABNORMAL HIGH (ref 0.9–1.2)
Prothrombin Time: 41.6 s — ABNORMAL HIGH (ref 9.1–12.0)

## 2023-11-11 LAB — VITAMIN D 25 HYDROXY (VIT D DEFICIENCY, FRACTURES): Vit D, 25-Hydroxy: 7.6 ng/mL — ABNORMAL LOW (ref 30.0–100.0)

## 2023-11-11 LAB — LIPID PANEL
Chol/HDL Ratio: 2.4 ratio (ref 0.0–4.4)
Cholesterol, Total: 178 mg/dL (ref 100–199)
HDL: 75 mg/dL (ref >39)
LDL Chol Calc (NIH): 89 mg/dL (ref 0–99)
Triglycerides: 75 mg/dL (ref 0–149)
VLDL Cholesterol Cal: 14 mg/dL (ref 5–40)

## 2023-11-11 LAB — TSH+FREE T4
Free T4: 1.08 ng/dL (ref 0.82–1.77)
TSH: 0.503 u[IU]/mL (ref 0.450–4.500)

## 2023-11-11 LAB — B12 AND FOLATE PANEL
Folate: 10.9 ng/mL (ref >3.0)
Vitamin B-12: 618 pg/mL (ref 232–1245)

## 2023-11-12 ENCOUNTER — Ambulatory Visit: Payer: Self-pay | Admitting: Physician Assistant

## 2023-11-12 NOTE — Progress Notes (Signed)
 Delainey,   Vitamin D is very low! High dose weekly sent to start.  INR is too high. Need to decrease what you are taking. COnfirm with me that is what you are on if not on this. Hold next 2 days then start 7.5mg  daily except Wednesday take 1/2 tablet.

## 2023-11-13 ENCOUNTER — Encounter: Payer: Self-pay | Admitting: Physician Assistant

## 2023-11-15 ENCOUNTER — Encounter: Payer: Self-pay | Admitting: Physician Assistant

## 2023-11-15 MED ORDER — VITAMIN D (ERGOCALCIFEROL) 1.25 MG (50000 UNIT) PO CAPS: 50000.0000 [IU] | ORAL_CAPSULE | ORAL | 1 refills | Status: AC

## 2023-11-16 ENCOUNTER — Telehealth: Payer: Self-pay

## 2023-11-16 ENCOUNTER — Other Ambulatory Visit (HOSPITAL_COMMUNITY): Payer: Self-pay

## 2023-11-16 NOTE — Telephone Encounter (Signed)
 Pharmacy Patient Advocate Encounter   Received notification from CoverMyMeds that prior authorization for Metaxalone 800mg  tabs is required/requested.   Insurance verification completed.   The patient is insured through [employer].   Per test claim: PA required; PA submitted to above mentioned insurance via Latent Key/confirmation #/EOC [id number] Status is pending

## 2023-11-17 ENCOUNTER — Other Ambulatory Visit (HOSPITAL_COMMUNITY): Payer: Self-pay

## 2023-11-17 NOTE — Telephone Encounter (Signed)
 Pharmacy Patient Advocate Encounter  Received notification from [employer] that Prior Authorization for Metaxalone 800mg  tabs has been APPROVED from 11/16/23 to 11/15/24   PA #/Case ID/Reference #: [id number]

## 2023-11-18 ENCOUNTER — Other Ambulatory Visit (HOSPITAL_COMMUNITY): Payer: Self-pay

## 2023-11-19 MED ORDER — VITAMIN D (ERGOCALCIFEROL) 1.25 MG (50000 UNIT) PO CAPS: 50000.0000 [IU] | ORAL_CAPSULE | ORAL | 0 refills | Status: AC

## 2023-11-26 ENCOUNTER — Other Ambulatory Visit: Payer: Self-pay | Admitting: Physician Assistant

## 2023-11-26 ENCOUNTER — Ambulatory Visit: Admitting: Physician Assistant

## 2023-11-26 VITALS — BP 100/57 | HR 77 | Resp 18 | Ht 67.0 in | Wt 98.0 lb

## 2023-11-26 DIAGNOSIS — G479 Sleep disorder, unspecified: Secondary | ICD-10-CM

## 2023-11-26 DIAGNOSIS — R053 Chronic cough: Secondary | ICD-10-CM

## 2023-11-26 DIAGNOSIS — Z7901 Long term (current) use of anticoagulants: Secondary | ICD-10-CM

## 2023-11-26 LAB — POCT INR
INR: 2.7 (ref 2.0–3.0)
INR: 2.7 (ref 2.0–3.0)

## 2023-11-26 NOTE — Progress Notes (Unsigned)
 This encounter was created in error - please disregard.Stay at same dose. Recheck in 4 weeks. 7.5mg  daily.

## 2023-11-26 NOTE — Progress Notes (Signed)
   Subjective:    Patient ID: Valerie Pratt, female    DOB: [birth year], 58 y.o.   MRN: [id number]  HPI    Review of Systems     Objective:   Physical Exam        Assessment & Plan:

## 2023-11-30 ENCOUNTER — Other Ambulatory Visit: Payer: Self-pay

## 2023-11-30 ENCOUNTER — Encounter: Payer: Self-pay | Admitting: Physician Assistant

## 2023-11-30 DIAGNOSIS — Z7901 Long term (current) use of anticoagulants: Secondary | ICD-10-CM

## 2023-11-30 DIAGNOSIS — E1065 Type 1 diabetes mellitus with hyperglycemia: Secondary | ICD-10-CM | POA: Diagnosis not present

## 2023-11-30 DIAGNOSIS — R634 Abnormal weight loss: Secondary | ICD-10-CM | POA: Diagnosis not present

## 2023-11-30 MED ORDER — WARFARIN SODIUM 7.5 MG PO TABS: 7.5000 mg | ORAL_TABLET | Freq: Every day | ORAL | 2 refills | Status: AC

## 2023-12-20 ENCOUNTER — Other Ambulatory Visit: Payer: Self-pay | Admitting: Physician Assistant

## 2023-12-20 DIAGNOSIS — F411 Generalized anxiety disorder: Secondary | ICD-10-CM

## 2023-12-24 ENCOUNTER — Ambulatory Visit

## 2023-12-24 VITALS — BP 113/60 | HR 78 | Resp 18 | Ht 67.0 in | Wt 98.0 lb

## 2023-12-24 DIAGNOSIS — Z7901 Long term (current) use of anticoagulants: Secondary | ICD-10-CM | POA: Diagnosis not present

## 2023-12-24 LAB — POCT INR: INR: 8 — AB (ref 2.0–3.0)

## 2023-12-24 NOTE — Progress Notes (Signed)
.. °  Results for orders placed or performed in visit on 12/24/23  POCT INR   Collection Time: 12/24/23  9:20 AM  Result Value Ref Range   INR 8.0 (A) 2.0 - 3.0   INR very elevated today.  Recheck in blood. Hold coumadin  dose for today and tomorrow until we can get new read.

## 2023-12-25 LAB — PROTIME-INR
INR: 7.5 (ref 0.9–1.2)
Prothrombin Time: 71.8 s — ABNORMAL HIGH (ref 9.1–12.0)

## 2023-12-25 LAB — SPECIMEN STATUS REPORT

## 2023-12-27 ENCOUNTER — Ambulatory Visit: Payer: Self-pay | Admitting: Physician Assistant

## 2023-12-27 NOTE — Progress Notes (Signed)
 Start back coumadin  at 7.5mg  on Monday, Wednesday, Fridays and 1/2 tablet all other days. Recheck in one week.

## 2024-01-05 ENCOUNTER — Ambulatory Visit: Admitting: Family Medicine

## 2024-01-05 VITALS — BP 114/62 | HR 80 | Ht 67.0 in

## 2024-01-05 DIAGNOSIS — Z7901 Long term (current) use of anticoagulants: Secondary | ICD-10-CM | POA: Diagnosis not present

## 2024-01-05 LAB — POCT INR: INR: 2.7 (ref 2.0–3.0)

## 2024-01-05 NOTE — Progress Notes (Signed)
" ° °  Established Patient Office Visit  Subjective   Patient ID: Valerie Pratt, female    DOB: December 26, 1965  Age: 58 y.o. MRN: [id number]  No chief complaint on file.   HPI  Coumadin  clinic- INR check  nurse visit. Patient denies unexplained bleeding or bruising ( one small place on Left arm that is known to be related to recent blood draw) ,  no alcohol use.   No changes in diet or appetite. No missed or extra medication doses.   ROS    Objective:     BP 114/62   Pulse 80   Ht 5' 7 (1.702 m)   SpO2 95%   BMI 15.35 kg/m    Physical Exam   Results for orders placed or performed in visit on 01/05/24  POCT INR  Result Value Ref Range   INR 2.7 2.0 - 3.0      The ASCVD Risk score (Arnett DK, et al., 2019) failed to calculate for the following reasons:   Risk score cannot be calculated because patient has a medical history suggesting prior/existing ASCVD   * - Cholesterol units were assumed    Assessment & Plan:  Per Dr. Alvan return in 2 weeks for INR check as nurse visit. Also per Dr. Alvan patient informed that she could schedule an appt with Jade Breeback, PA to discuss the possibility of changing form coumadin  to another medication such as Eliquis that would not require frequent INR cheking. She is agreeable to this and will schedule this visit with Vermell Bologna, PA Problem List Items Addressed This Visit       Other   Anticoagulated - Primary    No follow-ups on file.    Suzen SHAUNNA Plenty, LPN  "

## 2024-01-19 ENCOUNTER — Ambulatory Visit (INDEPENDENT_AMBULATORY_CARE_PROVIDER_SITE_OTHER): Admitting: Physician Assistant

## 2024-01-19 DIAGNOSIS — Z7901 Long term (current) use of anticoagulants: Secondary | ICD-10-CM

## 2024-01-19 LAB — POCT INR: INR: 2 (ref 2.0–3.0)

## 2024-01-19 NOTE — Progress Notes (Signed)
Same dose; recheck INR in 2 weeks.

## 2024-02-14 ENCOUNTER — Ambulatory Visit

## 2024-05-10 ENCOUNTER — Ambulatory Visit: Admitting: Physician Assistant

## 2024-08-28 ENCOUNTER — Ambulatory Visit
